# Patient Record
Sex: Male | Born: 1965 | Race: White | Hispanic: No | Marital: Married | Smoking: Never smoker
Health system: Southern US, Community
[De-identification: ages and names within clinical notes are randomized; demographics above are authoritative.]

---

## 2005-05-22 ENCOUNTER — Ambulatory Visit: Payer: Self-pay | Admitting: Oncology

## 2011-01-06 ENCOUNTER — Emergency Department: Payer: Self-pay | Admitting: Emergency Medicine

## 2011-10-02 ENCOUNTER — Ambulatory Visit: Payer: Self-pay | Admitting: Unknown Physician Specialty

## 2011-11-11 ENCOUNTER — Ambulatory Visit: Payer: Self-pay | Admitting: Unknown Physician Specialty

## 2011-11-11 LAB — URINALYSIS, COMPLETE
Blood: NEGATIVE
Glucose,UR: NEGATIVE mg/dL (ref 0–75)
Nitrite: NEGATIVE
Ph: 7 (ref 4.5–8.0)
RBC,UR: 1 /HPF (ref 0–5)
Specific Gravity: 1.018 (ref 1.003–1.030)
Squamous Epithelial: <1

## 2011-11-11 LAB — CBC
HCT: 44.3 % (ref 40.0–52.0)
HGB: 15.4 g/dL (ref 13.0–18.0)
MCH: 32.9 pg (ref 26.0–34.0)
MCHC: 34.7 g/dL (ref 32.0–36.0)
MCV: 95 fL (ref 80–100)
Platelet: 150 10*3/uL (ref 150–440)
RBC: 4.67 10*6/uL (ref 4.40–5.90)
WBC: 5.2 10*3/uL (ref 3.8–10.6)

## 2011-11-13 ENCOUNTER — Ambulatory Visit: Payer: Self-pay | Admitting: Unknown Physician Specialty

## 2014-09-23 ENCOUNTER — Ambulatory Visit: Payer: Self-pay | Admitting: Unknown Physician Specialty

## 2015-02-05 NOTE — Op Note (Signed)
PATIENT NAME:  Eric Joseph, [name] MR#:  [id number] DATE OF BIRTH:  Jun 16, 1966  DATE OF PROCEDURE:  11/13/2011  PREOPERATIVE DIAGNOSIS: Torn menisci, right knee, with possible chondral lesions.   POSTOPERATIVE DIAGNOSIS: Torn menisci, right knee, with possible chondral lesions.    OPERATION: Arthroscopic partial medial and lateral meniscectomies along with debridement and microfracture of multiple chondral lesions.   SURGEON: Alda BertholdHarold B. Jaidyn Kuhl, Jr., MD   ANESTHESIA: General.   HISTORY: The patient had had a remote ACL reconstruction of his right knee. Recently he was involved in an injury that had resulted in some discomfort referable to his right knee. MRI was ultimately obtained which was consistent with medial and lateral meniscal tears along with multiple chondral lesions. His ACL was felt to be intact. KT-1000 was performed which revealed mild laxity in his ACL reconstruction.   The patient was ultimately brought to surgery because of persistent discomfort despite conservative treatment.   PROCEDURE: The patient was taken to the operating room where satisfactory general anesthesia was achieved. A tourniquet and legholder were applied to the right thigh and the left lower extremity was supported with a well legholder. The right knee was prepped and draped in the usual fashion for an arthroscopic procedure. The patient was given 2 grams Kefzol IV prior to the start of the procedure. An inflow cannula was introduced superomedially. The joint was distended with lactated Ringer's. We used the Mitek fluid pump to facilitate joint distention.   The scope was introduced through an inferolateral puncture wound and a probe through an inferomedial puncture wound. 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 It measured about 8 or 9 mm in width and probably about 15 mm in depth. The patient had  had a previous resection of the posterior horn of his medial meniscus. He did have an anterior horn tear which I resected with a motorized resector and contoured with the thermal wand. I then debrided the medial femoral chondral lesion down to bone using a synovial resector and a curette. I then microfractured the lesion with a 45 degree awl. I turned my attention to the intercondylar notch.   There was considerable fraying of his ACL graft. I debrided the frayed tissue with a synovial resector and an ArthroCare 90 degree angled wand. I probed the ligament and the bulk of the reconstructed ACL ligament was intact. It was slightly loose so I tightened it up using the ArthroCare CapSure wand.   I then turned my attention to the lateral compartment. The patient had a tear of the middle and anterior thirds of his lateral meniscus.    I went ahead and resected the tear using a combination of a motorized resector and an ArthroCare thermal wand.   I did find a small connection between the midportion of the lateral meniscus tear and the previously noted parameniscal cyst. I debrided this connection with a small synovial resector and from outside in I introduced a spinal needle into the tunnel to try to promote some vascularity.   I contoured the resected area of the lateral meniscus with a 90 degree angled ArthroCare wand.   Inspection of the articular surface of the lateral compartment revealed the patient had grade II chondral changes in the weightbearing portion of his lateral femoral condyle.    Next, I inspected the trochlear groove.    The patient had a grade III chondral lesion in the  proximal portion of his trochlear groove. I debrided this area with a synovial resector and used a resector and a curette to debride down to bone. I then microfractured the lesion with a 45 degree awl.   I then inspected the retropatellar surface. There was some fibrillation of the retropatellar surface which I  debrided with a turbo whisker and Coblated with an ArthroCare Paragon wand.   The fluid in the joint was then decompressed through the cannulas. The cannulas were removed. The puncture wounds were closed with 3-0 nylon in vertical mattress fashion. Several mL of 0.5% Marcaine with epinephrine was injected about each puncture wound and then Betadine was applied to the wounds followed by a sterile dressing. Four TENS pads were placed about the left knee and then the patient was awakened and transferred to his stretcher bed. He was taken to the recovery room in satisfactory condition. Blood loss was negligible.   Incidentally, the tourniquet was not inflated during the course of the procedure.   ____________________________ Alda Berthold., MD hbk:drc D: 11/13/2011 11:11:00 ET T: 11/13/2011 11:21:02 ET JOB#: [id number]  cc: Alda Berthold., MD, <Dictator> Randon Goldsmith, Montez Hageman MD ELECTRONICALLY SIGNED 12/01/2011 14:45

## 2015-03-18 ENCOUNTER — Emergency Department
Admission: EM | Admit: 2015-03-18 | Discharge: 2015-03-18 | Disposition: A | Discharge status: Home / Self Care | Payer: 59 | Attending: Student | Admitting: Student

## 2015-03-18 ENCOUNTER — Other Ambulatory Visit: Payer: Self-pay

## 2015-03-18 ENCOUNTER — Emergency Department: Payer: 59

## 2015-03-18 ENCOUNTER — Encounter: Payer: Self-pay | Admitting: Radiology

## 2015-03-18 DIAGNOSIS — W11XXXA Fall on and from ladder, initial encounter: Secondary | ICD-10-CM | POA: Diagnosis not present

## 2015-03-18 DIAGNOSIS — S3992XA Unspecified injury of lower back, initial encounter: Secondary | ICD-10-CM | POA: Diagnosis not present

## 2015-03-18 DIAGNOSIS — Y9389 Activity, other specified: Secondary | ICD-10-CM | POA: Diagnosis not present

## 2015-03-18 DIAGNOSIS — Y9286 Slaughter house as the place of occurrence of the external cause: Secondary | ICD-10-CM | POA: Diagnosis not present

## 2015-03-18 DIAGNOSIS — Y998 Other external cause status: Secondary | ICD-10-CM | POA: Insufficient documentation

## 2015-03-18 DIAGNOSIS — M549 Dorsalgia, unspecified: Secondary | ICD-10-CM

## 2015-03-18 DIAGNOSIS — S3991XA Unspecified injury of abdomen, initial encounter: Secondary | ICD-10-CM | POA: Diagnosis not present

## 2015-03-18 DIAGNOSIS — S299XXA Unspecified injury of thorax, initial encounter: Secondary | ICD-10-CM | POA: Insufficient documentation

## 2015-03-18 DIAGNOSIS — M62838 Other muscle spasm: Secondary | ICD-10-CM

## 2015-03-18 DIAGNOSIS — R109 Unspecified abdominal pain: Secondary | ICD-10-CM

## 2015-03-18 DIAGNOSIS — S90511A Abrasion, right ankle, initial encounter: Secondary | ICD-10-CM | POA: Diagnosis not present

## 2015-03-18 DIAGNOSIS — R079 Chest pain, unspecified: Secondary | ICD-10-CM

## 2015-03-18 LAB — TROPONIN I: Troponin I: <0.03 ng/mL (ref <0.031)

## 2015-03-18 LAB — CBC WITH DIFFERENTIAL/PLATELET
Basophils Absolute: 0.1 10*3/uL (ref 0–0.1)
Basophils Relative: 1 %
EOS PCT: 1 %
Eosinophils Absolute: 0.1 10*3/uL (ref 0–0.7)
HCT: 50.1 % (ref 40.0–52.0)
Hemoglobin: 16.7 g/dL (ref 13.0–18.0)
Lymphocytes Relative: 17 %
Lymphs Abs: 1.9 10*3/uL (ref 1.0–3.6)
MCH: 32.2 pg (ref 26.0–34.0)
MCHC: 33.4 g/dL (ref 32.0–36.0)
MCV: 96.4 fL (ref 80.0–100.0)
MONOS PCT: 5 %
Monocytes Absolute: 0.6 10*3/uL (ref 0.2–1.0)
Neutro Abs: 8.4 10*3/uL — ABNORMAL HIGH (ref 1.4–6.5)
Neutrophils Relative %: 76 %
Platelets: 195 10*3/uL (ref 150–440)
RBC: 5.2 MIL/uL (ref 4.40–5.90)
RDW: 13.1 % (ref 11.5–14.5)
WBC: 11.1 10*3/uL — ABNORMAL HIGH (ref 3.8–10.6)

## 2015-03-18 LAB — COMPREHENSIVE METABOLIC PANEL
ALK PHOS: 71 U/L (ref 38–126)
ALT: 41 U/L (ref 17–63)
ANION GAP: 9 (ref 5–15)
AST: 34 U/L (ref 15–41)
Albumin: 4.6 g/dL (ref 3.5–5.0)
BUN: 19 mg/dL (ref 6–20)
CALCIUM: 9.6 mg/dL (ref 8.9–10.3)
CO2: 28 mmol/L (ref 22–32)
Chloride: 104 mmol/L (ref 101–111)
Creatinine, Ser: 0.86 mg/dL (ref 0.61–1.24)
GFR calc Af Amer: >60 mL/min (ref >60)
GFR calc non Af Amer: >60 mL/min (ref >60)
GLUCOSE: 102 mg/dL — AB (ref 65–99)
POTASSIUM: 4.2 mmol/L (ref 3.5–5.1)
Sodium: 141 mmol/L (ref 135–145)
Total Bilirubin: 0.7 mg/dL (ref 0.3–1.2)
Total Protein: 7.6 g/dL (ref 6.5–8.1)

## 2015-03-18 LAB — APTT: APTT: 25 s (ref 24–36)

## 2015-03-18 LAB — PROTIME-INR
INR: 0.91
PROTHROMBIN TIME: 12.5 s (ref 11.4–15.0)

## 2015-03-18 MED ORDER — CYCLOBENZAPRINE HCL 5 MG PO TABS: 5.0000 mg | ORAL_TABLET | Freq: Three times a day (TID) | ORAL | Status: DC | PRN

## 2015-03-18 MED ORDER — IBUPROFEN 600 MG PO TABS: 600.0000 mg | ORAL_TABLET | Freq: Four times a day (QID) | ORAL | Status: AC | PRN

## 2015-03-18 MED ORDER — ONDANSETRON HCL 4 MG/2ML IJ SOLN
INTRAMUSCULAR | Status: AC
  Administered 2015-03-18: 4 mg via INTRAVENOUS
  Filled 2015-03-18: qty 2

## 2015-03-18 MED ORDER — HYDROMORPHONE HCL 1 MG/ML IJ SOLN
INTRAMUSCULAR | Status: AC
  Administered 2015-03-18: 1 mg
  Filled 2015-03-18: qty 1

## 2015-03-18 MED ORDER — IOHEXOL 300 MG/ML  SOLN
100.0000 mL | Freq: Once | INTRAMUSCULAR | Status: AC | PRN
  Administered 2015-03-18: 100 mL via INTRAVENOUS

## 2015-03-18 MED ORDER — HYDROMORPHONE HCL 1 MG/ML IJ SOLN
INTRAMUSCULAR | Status: AC
  Administered 2015-03-18: 1 mg via INTRAVENOUS
  Filled 2015-03-18: qty 1

## 2015-03-18 MED ORDER — HYDROMORPHONE HCL 1 MG/ML IJ SOLN
1.0000 mg | Freq: Once | INTRAMUSCULAR | Status: AC
  Administered 2015-03-18: 1 mg via INTRAVENOUS

## 2015-03-18 MED ORDER — SODIUM CHLORIDE 0.9 % IV BOLUS (SEPSIS)
1000.0000 mL | Freq: Once | INTRAVENOUS | Status: AC
  Administered 2015-03-18: 1000 mL via INTRAVENOUS

## 2015-03-18 MED ORDER — BACITRACIN ZINC 500 UNIT/GM EX OINT
TOPICAL_OINTMENT | CUTANEOUS | Status: AC
  Filled 2015-03-18: qty 0.9

## 2015-03-18 MED ORDER — ONDANSETRON HCL 4 MG/2ML IJ SOLN
4.0000 mg | Freq: Once | INTRAMUSCULAR | Status: AC
  Administered 2015-03-18: 4 mg via INTRAVENOUS

## 2015-03-18 NOTE — ED Provider Notes (Signed)
[facility] Emergency Department Provider Note  ____________________________________________  Time seen: Approximately 4:09 PM  I have reviewed the triage vital signs and the nursing notes.   HISTORY  Chief Complaint Abdominal Injury    HPI Eric Joseph is a 49 y.o. male with no chronic medical problems presents for evaluation of chest, abdominal and back pain after fall from a ladder today. Patient was standing on a 6 foot tall ladder when he lost his footing and fell forward approximately 4-5 feet onto a retaining wall, landing on his abdomen. He did not hit his head and did not lose consciousness. He is not complaining of any head or neck pain. He reports that this happened at 1 PM. Prior to today he had been in his usual state of health. This happened suddenly, pain has been constant and worsening since onset. Currently his pain is severe and movement makes the pain worse. Prior to today he had been in his usual state of health without illness. Denies any alcohol or drug use.   History reviewed. No pertinent past medical history.  There are no active problems to display for this patient.   No past surgical history on file.  No current outpatient prescriptions on file.  Allergies Review of patient's allergies indicates no known allergies.  No family history on file.  Social History History  Substance Use Topics  . Smoking status: Not on file  . Smokeless tobacco: Not on file  . Alcohol Use: Not on file    Review of Systems Constitutional: No fever/chills Eyes: No visual changes. ENT: No sore throat. Cardiovascular: + chest pain. Respiratory: Denies shortness of breath. Gastrointestinal: + abdominal pain.  No nausea, no vomiting.  No diarrhea.  No constipation. Genitourinary: Negative for dysuria. Musculoskeletal: + for back pain. Skin: Negative for rash. Neurological: Negative for headaches, focal weakness or numbness.  10-point ROS  otherwise negative.  ____________________________________________   PHYSICAL EXAM:  VITAL SIGNS: ED Triage Vitals  Enc Vitals Group     BP 03/18/15 1551 143/97 mmHg     Pulse Rate 03/18/15 1551 78     Resp 03/18/15 1551 18     Temp 03/18/15 1551 97.8 F (36.6 C)     Temp Source 03/18/15 1551 Oral     SpO2 03/18/15 1551 98 %     Weight --      Height --      Head Cir --      Peak Flow --      Pain Score 03/18/15 1532 9     Pain Loc --      Pain Edu? --      Excl. in GC? --     Constitutional: Alert and oriented. Appears to be in severe distress secondary to pain. Eyes: Conjunctivae are normal. PERRL. EOMI. Head: Atraumatic. Nose: No congestion/rhinnorhea. Mouth/Throat: Mucous membranes are moist.  Oropharynx non-erythematous. Neck: No stridor. No cervical spine tenderness to palpation. Cardiovascular: Normal rate, regular rhythm. Grossly normal heart sounds.  Good peripheral circulation. Respiratory: Normal respiratory effort.  No retractions. Lungs CTAB. Gastrointestinal: Soft with moderate diffuse tenderness. No distention. No abdominal bruits. Large abrasions throughout the left abdomen. Genitourinary: deferred Musculoskeletal: Hemostatic Abrasion to the right anterior shin with some of the superficial skin listed. No bony tenderness, no step-off or abnormality throughout the right leg. Pulses stable to rock and compression. Moderate anterior chest wall tenderness, tender to palpation throughout the lower T/upper L-spine. Neurologic:  Normal speech and language. No gross  focal neurologic deficits are appreciated. Speech is normal. No gait instability. Skin:  Skin is warm, dry and intact. No rash noted. Psychiatric: Mood and affect are normal. Speech and behavior are normal.  ____________________________________________   LABS (all labs ordered are listed, but only abnormal results are displayed)  Labs Reviewed  CBC WITH DIFFERENTIAL/PLATELET - Abnormal; Notable for  the following:    WBC 11.1 (*)    Neutro Abs 8.4 (*)    All other components within normal limits  COMPREHENSIVE METABOLIC PANEL - Abnormal; Notable for the following:    Glucose, Bld 102 (*)    All other components within normal limits  TROPONIN I  APTT  PROTIME-INR   ____________________________________________  EKG  ED ECG REPORT I, Gayla DossGayle, Caron Tardif A, the attending physician, personally viewed and interpreted this ECG.   Date: 03/18/2015  EKG Time: 15:43  Rate: 78  Rhythm: normal sinus rhythm  Axis: Normal  Intervals:none, normal intervals  ST&T Change: No acute ST segment elevation  ____________________________________________  RADIOLOGY  CT chest, abdomen and pelvis IMPRESSION: Chest Impression:  No evidence of thoracic trauma  Abdomen / Pelvis Impression:  1. No evidence of solid organ injury in the abdomen or pelvis. 2. No evidence of fracture. ____________________________________________   PROCEDURES  Procedure(s) performed: None  Critical Care performed: Yes, see critical care note(s)  ____________________________________________   INITIAL IMPRESSION / ASSESSMENT AND PLAN / ED COURSE  Pertinent labs & imaging results that were available during my care of the patient were reviewed by me and considered in my medical decision making (see chart for details).  Eric Joseph is a 49 y.o. male with no chronic medical problems presents for evaluation of chest, abdominal and back pain after fall from a ladder today. On arrival, vital signs stable, afebrile. He is in moderate distress secondary to pain. He has tenderness throughout the chest wall, back, abdomen. Given his pain complaints and the nature of traumatic injury, we'll obtain labs, EKG, trauma scan CT of the chest, abdomen and pelvis. No head injury or loss of consciousness, no neck pain. No indication for imaging of the head or C-spine. Tetanus up to  date.  ----------------------------------------- 6:18 PM on 03/18/2015 -----------------------------------------  Labs and imaging unremarkable. Patient reports he feels much better. Suspect musculoskeletal pain with spasms as the primary cause of his symptoms in the setting of fall. Vital signs stable. Return precautions discussed. DC was symptomatically support ECP follow-up. Patient is, trouble with the discharge plan. ____________________________________________   FINAL CLINICAL IMPRESSION(S) / ED DIAGNOSES  Final diagnoses:  Fall from ladder, initial encounter  Chest pain, unspecified chest pain type  Abdominal pain, unspecified abdominal location  Bilateral back pain, unspecified location  Muscle spasm      Gayla DossEryka A Dody Smartt, MD 03/18/15 1819

## 2015-03-18 NOTE — ED Notes (Addendum)
Patient fell 4 feet off a ladder. Landed on a retaining wall with his abdomen. C/o pain to his abdomen and back. Abrasions but no bruising to abdomen noted. Patient states that the event happened at 13:00 and his pain has worsened with time.

## 2018-08-04 ENCOUNTER — Emergency Department
Admission: EM | Admit: 2018-08-04 | Discharge: 2018-08-04 | Disposition: A | Discharge status: Home / Self Care | Payer: BLUE CROSS/BLUE SHIELD | Attending: Emergency Medicine | Admitting: Emergency Medicine

## 2018-08-04 ENCOUNTER — Encounter: Payer: Self-pay | Admitting: Emergency Medicine

## 2018-08-04 ENCOUNTER — Emergency Department: Payer: BLUE CROSS/BLUE SHIELD

## 2018-08-04 ENCOUNTER — Other Ambulatory Visit: Payer: Self-pay

## 2018-08-04 DIAGNOSIS — Y92009 Unspecified place in unspecified non-institutional (private) residence as the place of occurrence of the external cause: Secondary | ICD-10-CM | POA: Insufficient documentation

## 2018-08-04 DIAGNOSIS — Z23 Encounter for immunization: Secondary | ICD-10-CM | POA: Insufficient documentation

## 2018-08-04 DIAGNOSIS — Z79899 Other long term (current) drug therapy: Secondary | ICD-10-CM | POA: Insufficient documentation

## 2018-08-04 DIAGNOSIS — S0083XA Contusion of other part of head, initial encounter: Secondary | ICD-10-CM | POA: Diagnosis not present

## 2018-08-04 DIAGNOSIS — W01198A Fall on same level from slipping, tripping and stumbling with subsequent striking against other object, initial encounter: Secondary | ICD-10-CM | POA: Diagnosis not present

## 2018-08-04 DIAGNOSIS — S0081XA Abrasion of other part of head, initial encounter: Secondary | ICD-10-CM | POA: Insufficient documentation

## 2018-08-04 DIAGNOSIS — Y999 Unspecified external cause status: Secondary | ICD-10-CM | POA: Diagnosis not present

## 2018-08-04 DIAGNOSIS — Y9301 Activity, walking, marching and hiking: Secondary | ICD-10-CM | POA: Diagnosis not present

## 2018-08-04 DIAGNOSIS — S0990XA Unspecified injury of head, initial encounter: Secondary | ICD-10-CM | POA: Diagnosis present

## 2018-08-04 MED ORDER — MELOXICAM 15 MG PO TABS
15.0000 mg | ORAL_TABLET | Freq: Every day | ORAL | 0 refills | Status: AC
Start: 2018-08-04 — End: ?

## 2018-08-04 MED ORDER — CEPHALEXIN 500 MG PO CAPS: 1000.0000 mg | ORAL_CAPSULE | Freq: Two times a day (BID) | ORAL | 0 refills | Status: DC

## 2018-08-04 MED ORDER — TETANUS-DIPHTH-ACELL PERTUSSIS 5-2.5-18.5 LF-MCG/0.5 IM SUSP
0.5000 mL | Freq: Once | INTRAMUSCULAR | Status: AC
  Administered 2018-08-04: 0.5 mL via INTRAMUSCULAR
  Filled 2018-08-04: qty 0.5

## 2018-08-04 NOTE — ED Notes (Signed)
See triage note  States he tripped and slid face first on gravel  Abrasion noted to forehead  Swelling and bruising noted under eyes

## 2018-08-04 NOTE — ED Triage Notes (Signed)
Pt in via POV, reports mechanical fall outside Sunday night, tripping over a bin, landing on trash bag w/ face hitting gravel.  Pt denies LOC, denies being on blood thinners.  Pt reports swelling and tenderness has become worse over the last day.  Pt with excoriation to forehead, swelling noted from base of forehead, left eye and into bridge of nose.  Pt ambulatory to triage, denies any neck pain.  NAD noted at this time.

## 2018-08-04 NOTE — ED Provider Notes (Signed)
[facility] Emergency Department Provider Note  ____________________________________________  Time seen: Approximately 5:42 PM  I have reviewed the triage vital signs and the nursing notes.   HISTORY  Chief Complaint Facial Injury    HPI Eric Joseph is a 52 y.o. male who presents the emergency department for increasing pain, swelling, ecchymosis to the skull and face after head injury.  Patient reports that he was attempting to take some trash out 2 days prior, tripped and fell landing striking the frontal skull gravel.  Patient reports no loss of consciousness initially or subsequently to his head trauma.  Patient reports that he had some mild pain in the region but associated with a mild contusion.  Patient reports that since then he has had increasing pressure in the nasal ridge region, increasing swelling, increasing ecchymosis.  Patient initially did not have any ecchymosis or edema.  He did sustain abrasions to the forehead.  Patient declines these.  He is now experiencing increasing edema as well as bilateral orbital ecchymosis.  Patient denies any serosanguineous fluid drainage from the ears or nares.  Patient reports majority of pain is in the face.  He denies any significant headache.  Patient denies any visual changes, radicular symptoms in the upper or lower extremities, chest pain, shortness of breath, nausea, emesis.  Over-the-counter medications have not improved symptoms.  Patient does have a previous history of head injuries with multiple concussions.    History reviewed. No pertinent past medical history.  There are no active problems to display for this patient.   History reviewed. No pertinent surgical history.  Prior to Admission medications   Medication Sig Start Date End Date Taking? Authorizing Provider  cephALEXin (KEFLEX) 500 MG capsule Take 2 capsules (1,000 mg total) by mouth 2 (two) times daily. 08/04/18   Aliou Mealey, Delorise Royals,  PA-C  cyclobenzaprine (FLEXERIL) 5 MG tablet Take 1 tablet (5 mg total) by mouth every 8 (eight) hours as needed for muscle spasms (Do Not drive while taking this medication.). 03/18/15   Gayla Doss, MD  DULoxetine (CYMBALTA) 30 MG capsule Take 30 mg by mouth at bedtime. 02/02/15   [provider]  ibuprofen (ADVIL,MOTRIN) 600 MG tablet Take 1 tablet (600 mg total) by mouth every 6 (six) hours as needed for moderate pain. 03/18/15   Gayla Doss, MD  meloxicam (MOBIC) 15 MG tablet Take 1 tablet (15 mg total) by mouth daily. 08/04/18   Ernesto Zukowski, Delorise Royals, PA-C  methylphenidate 10 MG ER tablet Take 10 mg by mouth daily.    [provider]    Allergies Patient has no known allergies.  No family history on file.  Social History Social History   Tobacco Use  . Smoking status: Never Smoker  . Smokeless tobacco: Never Used  Substance Use Topics  . Alcohol use: Not Currently  . Drug use: Never     Review of Systems  Constitutional: No fever/chills Eyes: No visual changes.  ENT: No upper respiratory complaints. Cardiovascular: no chest pain. Respiratory: no cough. No SOB. Gastrointestinal: No abdominal pain.  No nausea, no vomiting.  Musculoskeletal: Positive for pain to the medial face with edema over the nasal bridge and bilateral orbital ecchymosis. Skin: Deep abrasions to the forehead. Neurological: Negative for headaches, focal weakness or numbness. 10-point ROS otherwise negative.  ____________________________________________   PHYSICAL EXAM:  VITAL SIGNS: ED Triage Vitals  Enc Vitals Group     BP 08/04/18 1646 127/82     Pulse Rate  08/04/18 1646 71     Resp --      Temp 08/04/18 1646 98.1 F (36.7 C)     Temp Source 08/04/18 1646 Oral     SpO2 08/04/18 1646 97 %     Weight 08/04/18 1646 210 lb (95.3 kg)     Height 08/04/18 1646 6\' 2"  (1.88 m)     Head Circumference --      Peak Flow --      Pain Score 08/04/18 1654 2     Pain Loc --       Pain Edu? --      Excl. in GC? --      Constitutional: Alert and oriented. Well appearing and in no acute distress. Eyes: Conjunctivae are normal. PERRL. EOMI. Head: Visualization of the patient's face reveals deep abrasions to the forehead.  These were both scabbed over with no bleeding.  No surrounding edema or ecchymosis.  Visualization of the nasal bridge reveals edema but no laceration, abrasions or significant ecchymosis.  Visualization of the orbits reveals bilateral ecchymosis in the inferior orbits.  Mild edema in this region as well.  Patient is very tender to palpation along the nasal bridge, inferior orbits.  No palpable abnormality or subcutaneous emphysema.  No battle signs.  No serosanguineous fluid drainage from the ears or nares.  Patient is nontender to palpation over the remaining osseous structures of the skull and face. ENT:      Ears:       Nose: No congestion/rhinnorhea.      Mouth/Throat: Mucous membranes are moist.  Neck: No stridor.  No cervical spine tenderness to palpation.  Full range of motion to the cervical spine.  Radial pulse intact bilateral upper extremities.  Sensation intact and equal bilateral upper extremities.  Cardiovascular: Normal rate, regular rhythm. Normal S1 and S2.  Good peripheral circulation. Respiratory: Normal respiratory effort without tachypnea or retractions. Lungs CTAB. Good air entry to the bases with no decreased or absent breath sounds. Musculoskeletal: Full range of motion to all extremities. No gross deformities appreciated. Neurologic:  Normal speech and language. No gross focal neurologic deficits are appreciated.  Cranial nerves II through XII grossly intact.  Negative Romberg's and pronator drift. Skin:  Skin is warm, dry and intact. No rash noted. Psychiatric: Mood and affect are normal. Speech and behavior are normal. Patient exhibits appropriate insight and judgement.   ____________________________________________    LABS (all labs ordered are listed, but only abnormal results are displayed)  Labs Reviewed - No data to display ____________________________________________  EKG   ____________________________________________  RADIOLOGY I personally viewed and evaluated these images as part of my medical decision making, as well as reviewing the written report by the radiologist.  Ct Head Wo Contrast  Result Date: 08/04/2018 CLINICAL DATA:  52 y/o M; fall 2 days prior with head injury. Increasing pain to the frontal skull. Raccoon eyes. Headache. EXAM: CT HEAD WITHOUT CONTRAST CT MAXILLOFACIAL WITHOUT CONTRAST CT CERVICAL SPINE WITHOUT CONTRAST TECHNIQUE: Multidetector CT imaging of the head, cervical spine, and maxillofacial structures were performed using the standard protocol without intravenous contrast. Multiplanar CT image reconstructions of the cervical spine and maxillofacial structures were also generated. COMPARISON:  None. FINDINGS: CT HEAD FINDINGS Brain: No evidence of acute infarction, hemorrhage, hydrocephalus, extra-axial collection or mass lesion/mass effect. Vascular: No hyperdense vessel or unexpected calcification. Skull: Mild frontal scalp contusion. No calvarial fracture. Other: None. CT MAXILLOFACIAL FINDINGS Osseous: Mildly displaced fracture of right nasal bones, age indeterminate. Chronic  appearing fracture of the anterior maxillary spine. Orbits: Negative. No traumatic or inflammatory finding. Sinuses: Clear. Soft tissues: Mild swelling of frontal scalp, periorbital soft tissues, and nasal bridge. CT CERVICAL SPINE FINDINGS Alignment: Normal. Skull base and vertebrae: No acute fracture. No primary bone lesion or focal pathologic process. Partial C5-6 interbody and facet fusion on the left. 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 Soft tissues and spinal canal: No prevertebral fluid or swelling. No visible canal  hematoma. Disc levels: Discogenic degenerative changes with moderate loss of disc space height at the C3-4 level and mild loss of intervertebral disc space height at the C4-5 level. Advanced right upper cervical and left C4-5 facet hypertrophy. Uncovertebral and facet hypertrophy results in bony foraminal stenosis at the right C2-3 level, right C3-4 level, bilateral C4-5 level. Additionally, there is mild-to-moderate multifactorial canal stenosis greatest at C3-4. Upper chest: Negative. Other: Negative. IMPRESSION: CT head: 1. Mild frontal scalp contusion. No calvarial fracture. 2. No acute intracranial abnormality identified. CT maxillofacial: 1. Mildly displaced fracture of right nasal bones, age indeterminate. 2. Chronic appearing fracture of anterior maxillary spine. 3. Mild swelling of frontal scalp, periorbital soft tissues, and nasal bridge. CT cervical spine: 1. No acute fracture or dislocation identified. 2. Moderate cervical spondylosis greatest at C3-4. Electronically Signed   By: Mitzi Hansen M.D.   On: 08/04/2018 19:02   Ct Cervical Spine Wo Contrast  Result Date: 08/04/2018 CLINICAL DATA:  52 y/o M; fall 2 days prior with head injury. Increasing pain to the frontal skull. Raccoon eyes. Headache. EXAM: CT HEAD WITHOUT CONTRAST CT MAXILLOFACIAL WITHOUT CONTRAST CT CERVICAL SPINE WITHOUT CONTRAST TECHNIQUE: Multidetector CT imaging of the head, cervical spine, and maxillofacial structures were performed using the standard protocol without intravenous contrast. Multiplanar CT image reconstructions of the cervical spine and maxillofacial structures were also generated. COMPARISON:  None. FINDINGS: CT HEAD FINDINGS Brain: No evidence of acute infarction, hemorrhage, hydrocephalus, extra-axial collection or mass lesion/mass effect. Vascular: No hyperdense vessel or unexpected calcification. Skull: Mild frontal scalp contusion. No calvarial fracture. Other: None. CT MAXILLOFACIAL FINDINGS  Osseous: Mildly displaced fracture of right nasal bones, age indeterminate. Chronic appearing fracture of the anterior maxillary spine. Orbits: Negative. No traumatic or inflammatory finding. Sinuses: Clear. Soft tissues: Mild swelling of frontal scalp, periorbital soft tissues, and nasal bridge. CT CERVICAL SPINE FINDINGS Alignment: Normal. Skull base and vertebrae: No acute fracture. No primary bone lesion or focal pathologic process. Partial C5-6 interbody and facet fusion on the left. 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 Soft tissues and spinal canal: No prevertebral fluid or swelling. No visible canal hematoma. Disc levels: Discogenic degenerative changes with moderate loss of disc space height at the C3-4 level and mild loss of intervertebral disc space height at the C4-5 level. Advanced right upper cervical and left C4-5 facet hypertrophy. Uncovertebral and facet hypertrophy results in bony foraminal stenosis at the right C2-3 level, right C3-4 level, bilateral C4-5 level. Additionally, there is mild-to-moderate multifactorial canal stenosis greatest at C3-4. Upper chest: Negative. Other: Negative. IMPRESSION: CT head: 1. Mild frontal scalp contusion. No calvarial fracture. 2. No acute intracranial abnormality identified. CT maxillofacial: 1. Mildly displaced fracture of right nasal bones, age indeterminate. 2. Chronic appearing fracture of anterior maxillary spine. 3. Mild swelling of frontal scalp, periorbital soft tissues, and nasal bridge. CT cervical spine: 1. No acute fracture or dislocation identified. 2.  Moderate cervical spondylosis greatest at C3-4. Electronically Signed   By: Mitzi Hansen M.D.   On: 08/04/2018 19:02   Ct Maxillofacial Wo Contrast  Result Date: 08/04/2018 CLINICAL DATA:  52 y/o M; fall 2 days prior with head injury. Increasing pain to the frontal skull. Raccoon eyes. Headache. EXAM: CT HEAD  WITHOUT CONTRAST CT MAXILLOFACIAL WITHOUT CONTRAST CT CERVICAL SPINE WITHOUT CONTRAST TECHNIQUE: Multidetector CT imaging of the head, cervical spine, and maxillofacial structures were performed using the standard protocol without intravenous contrast. Multiplanar CT image reconstructions of the cervical spine and maxillofacial structures were also generated. COMPARISON:  None. FINDINGS: CT HEAD FINDINGS Brain: No evidence of acute infarction, hemorrhage, hydrocephalus, extra-axial collection or mass lesion/mass effect. Vascular: No hyperdense vessel or unexpected calcification. Skull: Mild frontal scalp contusion. No calvarial fracture. Other: None. CT MAXILLOFACIAL FINDINGS Osseous: Mildly displaced fracture of right nasal bones, age indeterminate. Chronic appearing fracture of the anterior maxillary spine. Orbits: Negative. No traumatic or inflammatory finding. Sinuses: Clear. Soft tissues: Mild swelling of frontal scalp, periorbital soft tissues, and nasal bridge. CT CERVICAL SPINE FINDINGS Alignment: Normal. Skull base and vertebrae: No acute fracture. No primary bone lesion or focal pathologic process. Partial C5-6 interbody and facet fusion on the left. 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 Soft tissues and spinal canal: No prevertebral fluid or swelling. No visible canal hematoma. Disc levels: Discogenic degenerative changes with moderate loss of disc space height at the C3-4 level and mild loss of intervertebral disc space height at the C4-5 level. Advanced right upper cervical and left C4-5 facet hypertrophy. Uncovertebral and facet hypertrophy results in bony foraminal stenosis at the right C2-3 level, right C3-4 level, bilateral C4-5 level. Additionally, there is mild-to-moderate multifactorial canal stenosis greatest at C3-4. Upper chest: Negative. Other: Negative. IMPRESSION: CT head: 1. Mild frontal scalp contusion. No  calvarial fracture. 2. No acute intracranial abnormality identified. CT maxillofacial: 1. Mildly displaced fracture of right nasal bones, age indeterminate. 2. Chronic appearing fracture of anterior maxillary spine. 3. Mild swelling of frontal scalp, periorbital soft tissues, and nasal bridge. CT cervical spine: 1. No acute fracture or dislocation identified. 2. Moderate cervical spondylosis greatest at C3-4. Electronically Signed   By: Mitzi Hansen M.D.   On: 08/04/2018 19:02    ____________________________________________    PROCEDURES  Procedure(s) performed:    Procedures    Medications  Tdap (BOOSTRIX) injection 0.5 mL (has no administration in time range)     ____________________________________________   INITIAL IMPRESSION / ASSESSMENT AND PLAN / ED COURSE  Pertinent labs & imaging results that were available during my care of the patient were reviewed by me and considered in my medical decision making (see chart for details).  Review of the [place] CSRS was performed in accordance of the [facility] prior to dispensing any controlled drugs.      Patient's diagnosis is consistent with abrasion of the face and contusion of the face.  Patient presents emergency department after a fall striking his face on the ground.  Patient had noticed increased edema, ecchymosis around the nasal bridge and periorbital region.  Patient did have positive raccoon eyes on exam.  Given findings, CT scan of the face, head, cervical spine was performed.  Patient has had a previous history of facial fractures, surgery.  There was an age indeterminant fracture of the nasal bridge.  I discussed the findings with patient, but without previous imaging studies on record, unable to  determine whether this is acute or from previous facial injuries.  At this time, no urgent intervention even should this be a nasal bridge fracture is warranted.  Patient will be placed on meloxicam for edema and pain relief.   Patient will be placed on antibiotics prophylactically given nature of multiple abrasions to the face.  Patient's tetanus shot was up-to-date and updated at this time.  Follow-up with primary care or ENT if necessary. Patient is given ED precautions to return to the ED for any worsening or new symptoms.     ____________________________________________  FINAL CLINICAL IMPRESSION(S) / ED DIAGNOSES  Final diagnoses:  Abrasion of face, initial encounter  Contusion of face, initial encounter      NEW MEDICATIONS STARTED DURING THIS VISIT:  ED Discharge Orders         Ordered    cephALEXin (KEFLEX) 500 MG capsule  2 times daily     08/04/18 2017    meloxicam (MOBIC) 15 MG tablet  Daily     08/04/18 2017              This chart was dictated using voice recognition software/Dragon. Despite best efforts to proofread, errors can occur which can change the meaning. Any change was purely unintentional.    Racheal Patches, PA-C 08/04/18 2020    Sharyn Creamer, MD 08/05/18 0010

## 2020-07-13 ENCOUNTER — Other Ambulatory Visit: Payer: Self-pay

## 2020-07-13 ENCOUNTER — Encounter: Payer: Self-pay | Admitting: Radiology

## 2020-07-13 ENCOUNTER — Emergency Department: Payer: 59

## 2020-07-13 ENCOUNTER — Emergency Department
Admission: EM | Admit: 2020-07-13 | Discharge: 2020-07-13 | Disposition: A | Discharge status: Home / Self Care | Payer: 59 | Attending: Emergency Medicine | Admitting: Emergency Medicine

## 2020-07-13 DIAGNOSIS — R519 Headache, unspecified: Secondary | ICD-10-CM | POA: Insufficient documentation

## 2020-07-13 DIAGNOSIS — Z20822 Contact with and (suspected) exposure to covid-19: Secondary | ICD-10-CM | POA: Diagnosis not present

## 2020-07-13 LAB — CBC WITH DIFFERENTIAL/PLATELET
Abs Immature Granulocytes: 0.02 10*3/uL (ref 0.00–0.07)
Basophils Absolute: 0.1 10*3/uL (ref 0.0–0.1)
Basophils Relative: 1 %
Eosinophils Absolute: 0.1 10*3/uL (ref 0.0–0.5)
Eosinophils Relative: 2 %
HCT: 44.3 % (ref 39.0–52.0)
Hemoglobin: 15.7 g/dL (ref 13.0–17.0)
Immature Granulocytes: 0 %
Lymphocytes Relative: 30 %
Lymphs Abs: 1.9 10*3/uL (ref 0.7–4.0)
MCH: 33.8 pg (ref 26.0–34.0)
MCHC: 35.4 g/dL (ref 30.0–36.0)
MCV: 95.3 fL (ref 80.0–100.0)
Monocytes Absolute: 0.5 10*3/uL (ref 0.1–1.0)
Monocytes Relative: 8 %
Neutro Abs: 3.8 10*3/uL (ref 1.7–7.7)
Neutrophils Relative %: 59 %
Platelets: 215 10*3/uL (ref 150–400)
RBC: 4.65 MIL/uL (ref 4.22–5.81)
RDW: 12.2 % (ref 11.5–15.5)
WBC: 6.3 10*3/uL (ref 4.0–10.5)
nRBC: 0 % (ref 0.0–0.2)

## 2020-07-13 LAB — COMPREHENSIVE METABOLIC PANEL
ALT: 35 U/L (ref 0–44)
AST: 23 U/L (ref 15–41)
Albumin: 4.1 g/dL (ref 3.5–5.0)
Alkaline Phosphatase: 66 U/L (ref 38–126)
Anion gap: 10 (ref 5–15)
BUN: 17 mg/dL (ref 6–20)
CO2: 27 mmol/L (ref 22–32)
Calcium: 8.8 mg/dL — ABNORMAL LOW (ref 8.9–10.3)
Chloride: 104 mmol/L (ref 98–111)
Creatinine, Ser: 0.75 mg/dL (ref 0.61–1.24)
GFR calc Af Amer: >60 mL/min (ref >60)
GFR calc non Af Amer: >60 mL/min (ref >60)
Glucose, Bld: 100 mg/dL — ABNORMAL HIGH (ref 70–99)
Potassium: 3.8 mmol/L (ref 3.5–5.1)
Sodium: 141 mmol/L (ref 135–145)
Total Bilirubin: 0.7 mg/dL (ref 0.3–1.2)
Total Protein: 6.8 g/dL (ref 6.5–8.1)

## 2020-07-13 LAB — RESPIRATORY PANEL BY RT PCR (FLU A&B, COVID)
Influenza A by PCR: NEGATIVE
Influenza B by PCR: NEGATIVE
SARS Coronavirus 2 by RT PCR: NEGATIVE

## 2020-07-13 LAB — SEDIMENTATION RATE: Sed Rate: 5 mm/hr (ref 0–20)

## 2020-07-13 MED ORDER — SODIUM CHLORIDE 0.9 % IV BOLUS
1000.0000 mL | Freq: Once | INTRAVENOUS | Status: AC
  Administered 2020-07-13: 1000 mL via INTRAVENOUS

## 2020-07-13 MED ORDER — IOHEXOL 350 MG/ML SOLN
75.0000 mL | Freq: Once | INTRAVENOUS | Status: AC | PRN
  Administered 2020-07-13: 75 mL via INTRAVENOUS
  Filled 2020-07-13: qty 75

## 2020-07-13 MED ORDER — DEXAMETHASONE SODIUM PHOSPHATE 10 MG/ML IJ SOLN
10.0000 mg | Freq: Once | INTRAMUSCULAR | Status: AC
  Administered 2020-07-13: 10 mg via INTRAVENOUS
  Filled 2020-07-13: qty 1

## 2020-07-13 MED ORDER — METOCLOPRAMIDE HCL 5 MG/ML IJ SOLN
10.0000 mg | Freq: Once | INTRAMUSCULAR | Status: AC
  Administered 2020-07-13: 10 mg via INTRAVENOUS
  Filled 2020-07-13: qty 2

## 2020-07-13 MED ORDER — KETOROLAC TROMETHAMINE 30 MG/ML IJ SOLN
30.0000 mg | Freq: Once | INTRAMUSCULAR | Status: AC
  Administered 2020-07-13: 30 mg via INTRAVENOUS
  Filled 2020-07-13: qty 1

## 2020-07-13 MED ORDER — AMOXICILLIN 500 MG PO CAPS
500.0000 mg | ORAL_CAPSULE | Freq: Once | ORAL | Status: AC
  Administered 2020-07-13: 500 mg via ORAL
  Filled 2020-07-13: qty 1

## 2020-07-13 MED ORDER — DIPHENHYDRAMINE HCL 50 MG/ML IJ SOLN
50.0000 mg | Freq: Once | INTRAMUSCULAR | Status: AC
  Administered 2020-07-13: 50 mg via INTRAVENOUS
  Filled 2020-07-13: qty 1

## 2020-07-13 MED ORDER — AMOXICILLIN 875 MG PO TABS: 875.0000 mg | ORAL_TABLET | Freq: Two times a day (BID) | ORAL | 0 refills | Status: AC

## 2020-07-13 NOTE — ED Provider Notes (Signed)
Emergency Department Provider Note  ____________________________________________  Time seen: Approximately 6:15 PM  I have reviewed the triage vital signs and the nursing notes.   HISTORY  Chief Complaint Migraine   Historian Patient     HPI Eric Joseph is a 54 y.o. male presents to the emergency department with an acute, 10 out of 10 left-sided temporal headache that started acutely today.  Patient states that last night he felt of having sensation along his left jaw and has had some mild discomfort chewing on the left.  He denies other falls or mechanisms of trauma.  He denies changes in vision, photophobia or floaters.  He denies a history of headaches and states he has never felt discomfort like this in the past.  He states that the pain has progressed slowly in intensity.  He denies fever and chills.  He saw his primary care provider earlier in the day and was referred to the emergency department.  He denies chest pain, chest tightness or abdominal pain.   History reviewed. No pertinent past medical history.   Immunizations up to date:  Yes.     History reviewed. No pertinent past medical history.  There are no problems to display for this patient.   No past surgical history on file.  Prior to Admission medications   Medication Sig Start Date End Date Taking? Authorizing Provider  amoxicillin (AMOXIL) 875 MG tablet Take 1 tablet (875 mg total) by mouth 2 (two) times daily for 10 days. 07/13/20 07/23/20  Lannie Fields, PA-C  cephALEXin (KEFLEX) 500 MG capsule Take 2 capsules (1,000 mg total) by mouth 2 (two) times daily. 08/04/18   Cuthriell, Charline Bills, PA-C  DULoxetine (CYMBALTA) 30 MG capsule Take 30 mg by mouth at bedtime. 02/02/15   [provider]  ibuprofen (ADVIL,MOTRIN) 600 MG tablet Take 1 tablet (600 mg total) by mouth every 6 (six) hours as needed for moderate pain. 03/18/15   Joanne Gavel, MD  meloxicam (MOBIC) 15 MG tablet Take 1 tablet  (15 mg total) by mouth daily. 08/04/18   Cuthriell, Charline Bills, PA-C  methylphenidate 10 MG ER tablet Take 10 mg by mouth daily.    [provider]    Allergies Patient has no known allergies.  No family history on file.  Social History Social History   Tobacco Use  . Smoking status: Never Smoker  . Smokeless tobacco: Never Used  Vaping Use  . Vaping Use: Never used  Substance Use Topics  . Alcohol use: Not Currently  . Drug use: Never     Review of Systems  Constitutional: No fever/chills Eyes:  No discharge ENT: No upper respiratory complaints. Respiratory: no cough. No SOB/ use of accessory muscles to breath Gastrointestinal:   No nausea, no vomiting.  No diarrhea.  No constipation. Musculoskeletal: Negative for musculoskeletal pain. Neuro: Patient has headache.  Skin: Negative for rash, abrasions, lacerations, ecchymosis.    ____________________________________________   PHYSICAL EXAM:  VITAL SIGNS: ED Triage Vitals [07/13/20 1651]  Enc Vitals Group     BP 127/82     Pulse Rate 64     Resp 16     Temp 98.1 F (36.7 C)     Temp Source Oral     SpO2 99 %     Weight 215 lb (97.5 kg)     Height 6' 2" (1.88 m)     Head Circumference      Peak Flow      Pain Score  10     Pain Loc      Pain Edu?      Excl. in [place]?      Constitutional: Alert and oriented. Well appearing and in no acute distress. Eyes: Conjunctivae are normal. PERRL. EOMI. Head: Atraumatic.  No palpable cord. ENT:      Ears: TM on the left is bulging and erythematous.      Nose: No congestion/rhinnorhea.      Mouth/Throat: Mucous membranes are moist.  Neck: No stridor.  No cervical spine tenderness to palpation. Cardiovascular: Normal rate, regular rhythm. Normal S1 and S2.  Good peripheral circulation. Respiratory: Normal respiratory effort without tachypnea or retractions. Lungs CTAB. Good air entry to the bases with no decreased or absent breath sounds Musculoskeletal: Full  range of motion to all extremities. No obvious deformities noted Neurologic:  Normal for age. No gross focal neurologic deficits are appreciated.  Skin:  Skin is warm, dry and intact. No rash noted. Psychiatric: Mood and affect are normal for age. Speech and behavior are normal.   ____________________________________________   LABS (all labs ordered are listed, but only abnormal results are displayed)  Labs Reviewed  COMPREHENSIVE METABOLIC PANEL - Abnormal; Notable for the following components:      Result Value   Glucose, Bld 100 (*)    Calcium 8.8 (*)    All other components within normal limits  RESPIRATORY PANEL BY RT PCR (FLU A&B, COVID)  CBC WITH DIFFERENTIAL/PLATELET  SEDIMENTATION RATE  C-REACTIVE PROTEIN   ____________________________________________  EKG   ____________________________________________  RADIOLOGY Unk Pinto, personally viewed and evaluated these images (plain radiographs) as part of my medical decision making, as well as reviewing the written report by the radiologist.  CT Angio Head W or Wo Contrast  Result Date: 07/13/2020 CLINICAL DATA:  Dizziness.  Migraine. EXAM: CT ANGIOGRAPHY HEAD TECHNIQUE: Multidetector CT imaging of the head was performed using the standard protocol during bolus administration of intravenous contrast. Multiplanar CT image reconstructions and MIPs were obtained to evaluate the vascular anatomy. CONTRAST:  56m OMNIPAQUE IOHEXOL 350 MG/ML SOLN COMPARISON:  None. FINDINGS: Anterior circulation: The internal carotid arteries are widely patent from skull base to carotid termini. ACAs and MCAs are patent without evidence of a proximal branch occlusion or significant proximal stenosis. Mild diffuse irregular appearance of the predominantly distal branch vessels is considered artifactual. No aneurysm is identified. Posterior circulation: The visualized distal vertebral arteries are patent to the basilar with the left being strongly  dominant. Patent right PICA, left AICA, and bilateral SCA origins are identified. The basilar artery is widely patent. Posterior communicating arteries are not identified and may be diminutive or absent. PCAs are patent without evidence of a significant proximal stenosis. No aneurysm is identified. Venous sinuses: Patent. Anatomic variants: None. IMPRESSION: Negative head CTA. Electronically Signed   By: ALogan BoresM.D.   On: 07/13/2020 19:47   CT Head Wo Contrast  Result Date: 07/13/2020 CLINICAL DATA:  Headache, classic migraine. EXAM: CT HEAD WITHOUT CONTRAST TECHNIQUE: Contiguous axial images were obtained from the base of the skull through the vertex without intravenous contrast. COMPARISON:  Head CT 08/04/2018. FINDINGS: Brain: Cerebral volume is normal. There is no acute intracranial hemorrhage. No demarcated cortical infarct. No extra-axial fluid collection. No evidence of intracranial mass. No midline shift. Partially empty sella turcica. Vascular: No hyperdense vessel. Skull: Normal. Negative for fracture or focal lesion. Sinuses/Orbits: Visualized orbits show no acute finding. Mild ethmoid sinus mucosal thickening. No significant mastoid  effusion. Mucosal thickening is also noted within the bilateral nasal passages. IMPRESSION: No evidence of acute intracranial abnormality. Partially empty sella turcica. This finding is very commonly incidental, but can be associated with idiopathic intracranial hypertension. Mild ethmoid sinus mucosal thickening. Mucosal thickening is also noted within the bilateral nasal passages. Electronically Signed   By: Kellie Simmering DO   On: 07/13/2020 17:20    ____________________________________________    PROCEDURES  Procedure(s) performed:     Procedures     Medications  metoCLOPramide (REGLAN) injection 10 mg (10 mg Intravenous Given 07/13/20 1826)  diphenhydrAMINE (BENADRYL) injection 50 mg (50 mg Intravenous Given 07/13/20 1826)  dexamethasone  (DECADRON) injection 10 mg (10 mg Intravenous Given 07/13/20 1825)  sodium chloride 0.9 % bolus 1,000 mL (0 mLs Intravenous Stopped 07/13/20 2024)  iohexol (OMNIPAQUE) 350 MG/ML injection 75 mL (75 mLs Intravenous Contrast Given 07/13/20 1920)  ketorolac (TORADOL) 30 MG/ML injection 30 mg (30 mg Intravenous Given 07/13/20 2025)  amoxicillin (AMOXIL) capsule 500 mg (500 mg Oral Given 07/13/20 2025)     ____________________________________________   INITIAL IMPRESSION / ASSESSMENT AND PLAN / ED COURSE  Pertinent labs & imaging results that were available during my care of the patient were reviewed by me and considered in my medical decision making (see chart for details).      Assessment and Plan: Headache:  54 year old male presents to the emergency department with left-sided temporal headache that started acutely today.  Vital signs were reassuring at triage.  On physical exam, patient was alert and active and nontoxic-appearing.  He had no palpable cord along the left temple.  On physical exam, left tympanic membrane was erythematous and effused with evidence of purulent exudate behind left TM.  Patient had no trismus and was able to speak in complete sentences.  His neuro exam was without deficits.  Differential diagnosis included intracranial bleed, aneurysm, temporal arteritis, migraine, otitis media...  ESR and CRP were within reference range, decreasing suspicion for temporal arteritis.  CT head revealed no evidence of intracranial bleed.  No signs of aneurysm on CT angio.  Patient reported that he did have some improvement in headache with Reglan, Decadron, Benadryl and Toradol administered in the emergency department.  Toradol was administered after CTs returned.  I discharge patient home with amoxicillin to be taken twice daily for the next 10 days.  Patient was given return precautions to return if his headache does not improve.  He has easy access to the emergency department should  his symptoms change or worsen.    ____________________________________________  FINAL CLINICAL IMPRESSION(S) / ED DIAGNOSES  Final diagnoses:  Acute nonintractable headache, unspecified headache type      NEW MEDICATIONS STARTED DURING THIS VISIT:  ED Discharge Orders         Ordered    amoxicillin (AMOXIL) 875 MG tablet  2 times daily        07/13/20 2052              This chart was dictated using voice recognition software/Dragon. Despite best efforts to proofread, errors can occur which can change the meaning. Any change was purely unintentional.     Karren Cobble 07/13/20 2107    Vladimir Crofts, MD 07/13/20 2113

## 2020-07-13 NOTE — ED Triage Notes (Signed)
Pt referred here by primary for severe migraine. Pt holding left side of head in triage c/o of pain between temple and eye socket. Pt NAD in triage.

## 2020-07-13 NOTE — ED Notes (Signed)
See triage note  Presents with headache  States pain is mainly to left temporal area  Denies any hx of h/a's  Was sent here by [facility]

## 2020-07-13 NOTE — ED Notes (Signed)
Pt sent over by [facility] for possible left temporal arteritis.

## 2020-07-13 NOTE — Discharge Instructions (Signed)
Take Amoxicillin twice daily for the next ten days.  

## 2020-07-14 LAB — C-REACTIVE PROTEIN: CRP: 0.5 mg/dL (ref <1.0)

## 2021-09-24 IMAGING — CT CT ANGIO HEAD
3 of 7 series · 17 of 47 positions shown · IV contrast (omnipaque)
Comparison: None.

CLINICAL DATA: Dizziness.  Migraine.

EXAM:
CT ANGIOGRAPHY HEAD
TECHNIQUE: Multidetector CT imaging of the head was performed using the
standard protocol during bolus administration of intravenous
contrast. Multiplanar CT image reconstructions and MIPs were
obtained to evaluate the vascular anatomy.
CONTRAST:  75mL OMNIPAQUE IOHEXOL 350 MG/ML SOLN

[Series 8: ax thin · axial · 0.38mm/px · z∈[-86,+43]mm · 11 of 162 slices shown]
[im 10/162  brain]
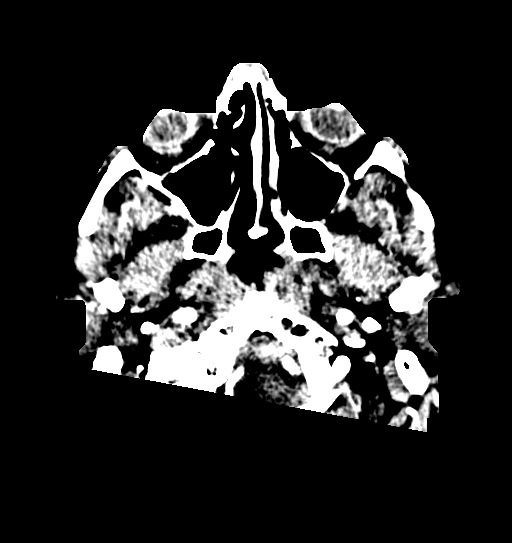
[im 29/162  bone]
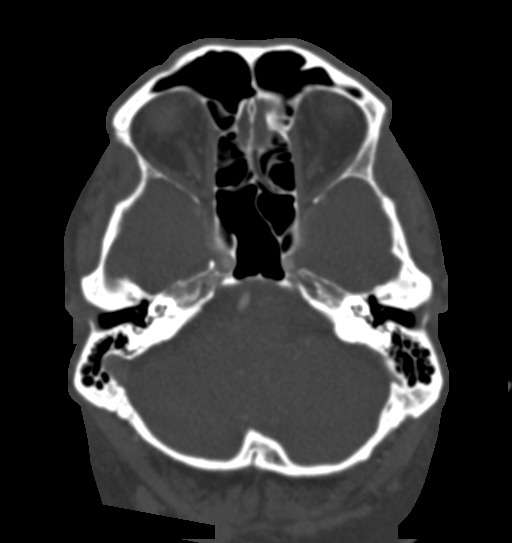
[im 38/162  brain]
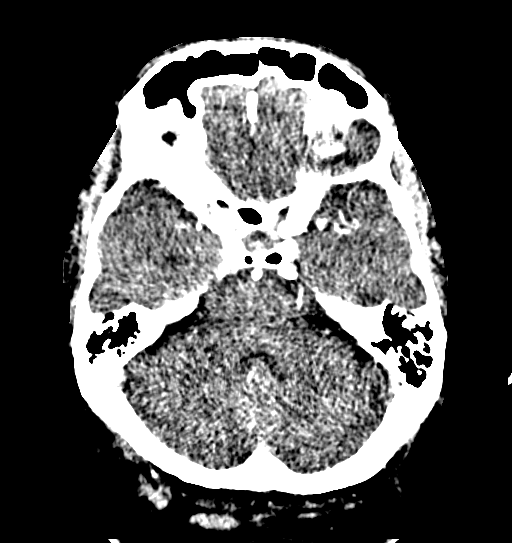
[im 57/162  bone]
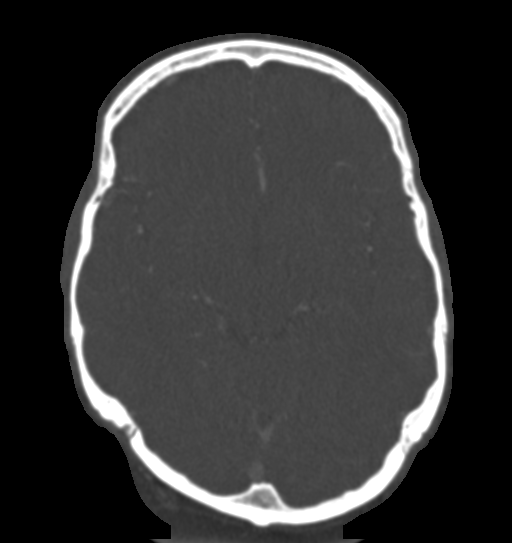
[im 67/162  brain]
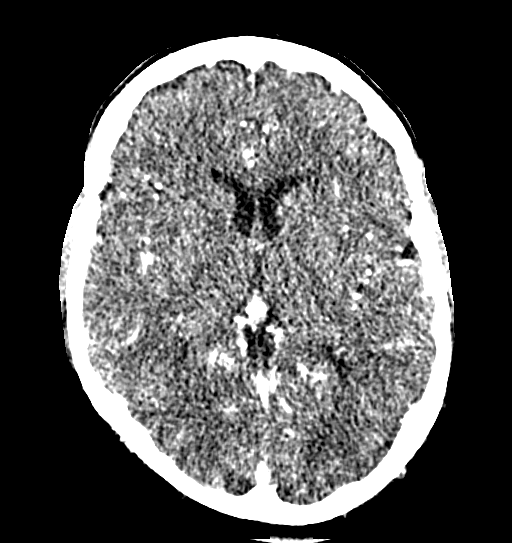
[im 86/162  bone]
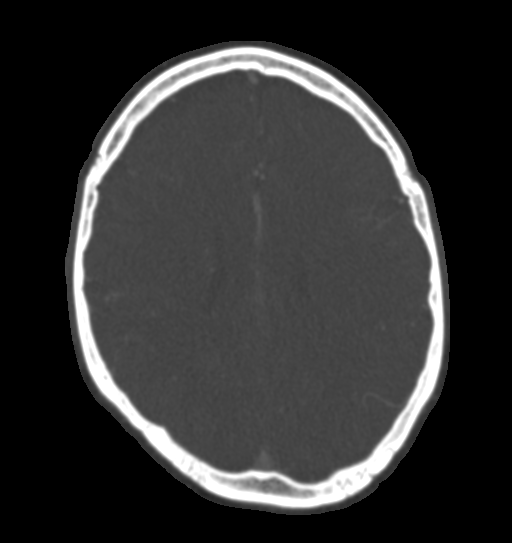
[im 95/162  brain]
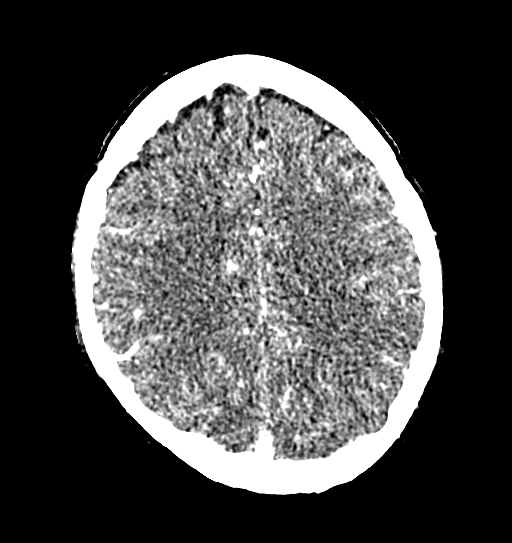
[im 105/162  bone]
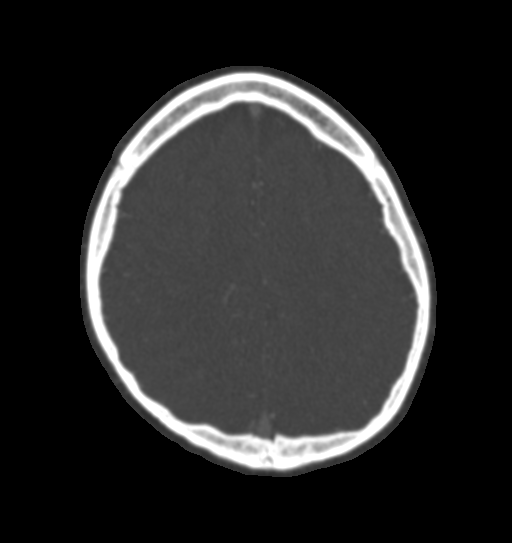
[im 124/162  brain]
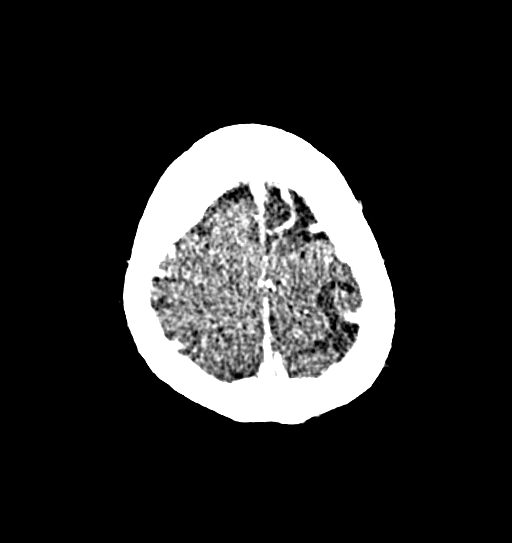
[im 133/162  bone]
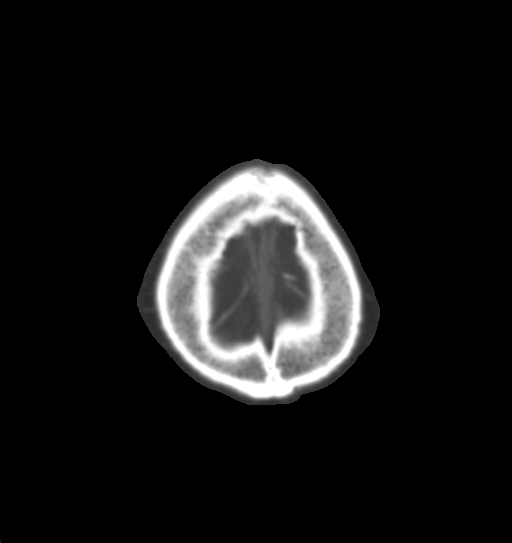
[im 152/162  brain]
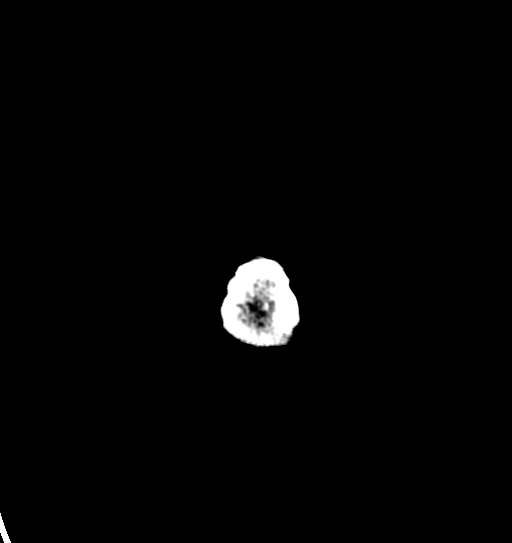

[Series 10: cor thin · coronal · 0.33mm/px · 3 of 209 slices shown]
[im 80/209  brain]
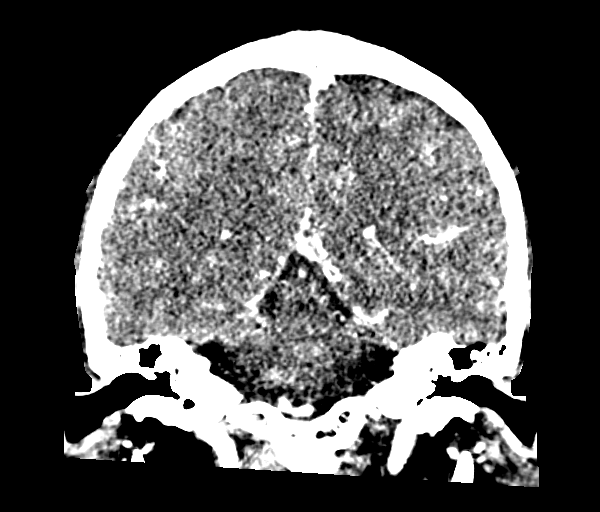
[im 102/209  brain]
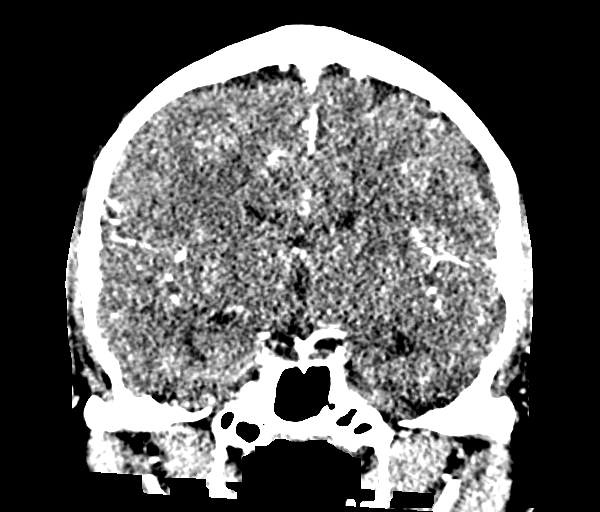
[im 123/209  brain]
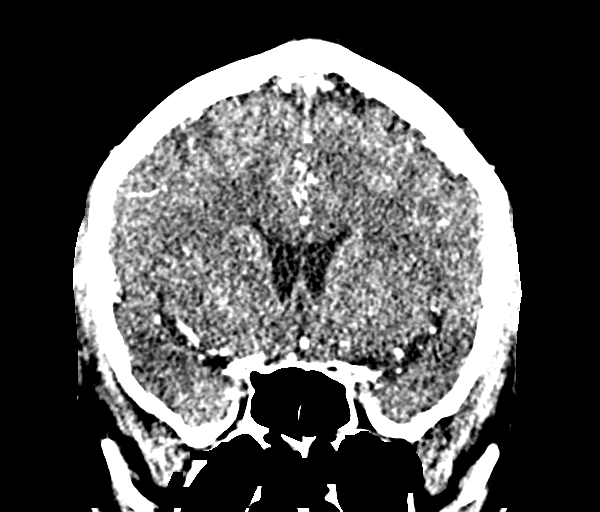

[Series 12: sag thin · sagittal · 0.35mm/px · 3 of 196 slices shown]
[im 76/196  brain]
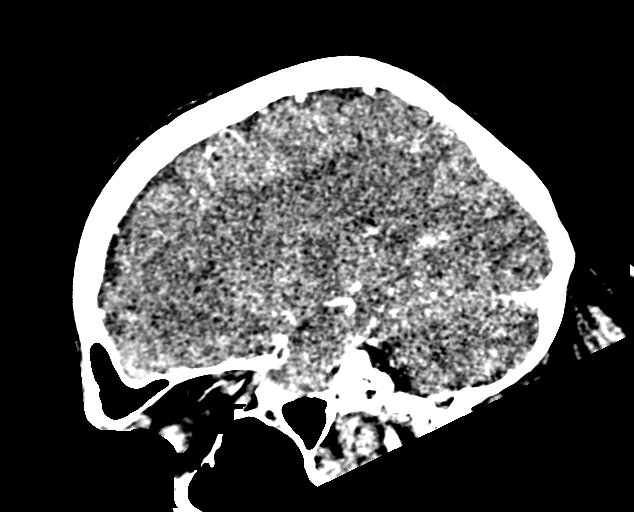
[im 113/196  brain]
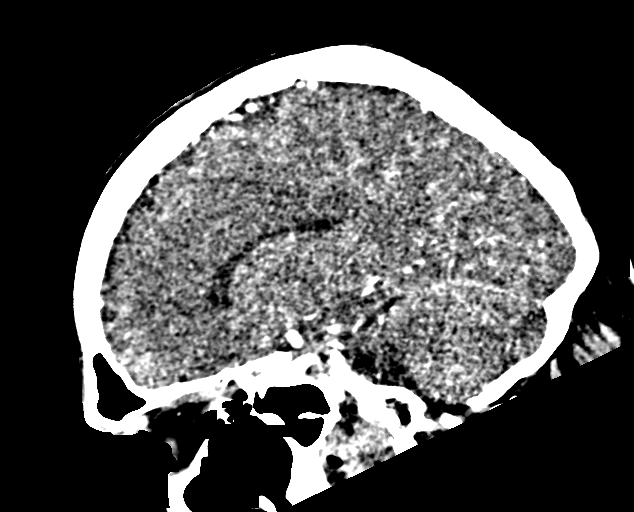
[im 151/196  brain]
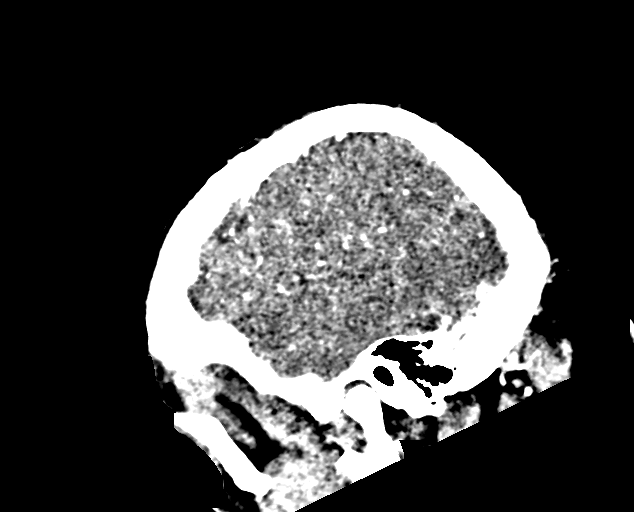

[17 of 47 positions shown; findings below may reference images not displayed]

FINDINGS: Anterior circulation: The internal carotid arteries are widely
patent from skull base to carotid termini. ACAs and MCAs are patent
without evidence of a proximal branch occlusion or significant
proximal stenosis. Mild diffuse irregular appearance of the
predominantly distal branch vessels is considered artifactual. No
aneurysm is identified.

Posterior circulation: The visualized distal vertebral arteries are
patent to the basilar with the left being strongly dominant. Patent
right PICA, left AICA, and bilateral SCA origins are identified. The
basilar artery is widely patent. Posterior communicating arteries
are not identified and may be diminutive or absent. PCAs are patent
without evidence of a significant proximal stenosis. No aneurysm is
identified.

Venous sinuses: Patent.

Anatomic variants: None.
IMPRESSION: Negative head CTA.

## 2021-11-29 DIAGNOSIS — M545 Low back pain, unspecified: Secondary | ICD-10-CM | POA: Diagnosis not present

## 2021-11-29 DIAGNOSIS — M47896 Other spondylosis, lumbar region: Secondary | ICD-10-CM | POA: Diagnosis not present

## 2021-12-10 DIAGNOSIS — R635 Abnormal weight gain: Secondary | ICD-10-CM | POA: Diagnosis not present

## 2021-12-10 DIAGNOSIS — M545 Low back pain, unspecified: Secondary | ICD-10-CM | POA: Diagnosis not present

## 2021-12-10 DIAGNOSIS — K219 Gastro-esophageal reflux disease without esophagitis: Secondary | ICD-10-CM | POA: Diagnosis not present

## 2021-12-10 DIAGNOSIS — Z0000 Encounter for general adult medical examination without abnormal findings: Secondary | ICD-10-CM | POA: Diagnosis not present

## 2021-12-10 DIAGNOSIS — R739 Hyperglycemia, unspecified: Secondary | ICD-10-CM | POA: Diagnosis not present

## 2022-01-23 DIAGNOSIS — D2272 Melanocytic nevi of left lower limb, including hip: Secondary | ICD-10-CM | POA: Diagnosis not present

## 2022-01-23 DIAGNOSIS — L821 Other seborrheic keratosis: Secondary | ICD-10-CM | POA: Diagnosis not present

## 2022-01-23 DIAGNOSIS — L300 Nummular dermatitis: Secondary | ICD-10-CM | POA: Diagnosis not present

## 2022-01-23 DIAGNOSIS — D2271 Melanocytic nevi of right lower limb, including hip: Secondary | ICD-10-CM | POA: Diagnosis not present

## 2022-01-23 DIAGNOSIS — D2262 Melanocytic nevi of left upper limb, including shoulder: Secondary | ICD-10-CM | POA: Diagnosis not present

## 2022-01-23 DIAGNOSIS — D225 Melanocytic nevi of trunk: Secondary | ICD-10-CM | POA: Diagnosis not present

## 2022-01-23 DIAGNOSIS — D2261 Melanocytic nevi of right upper limb, including shoulder: Secondary | ICD-10-CM | POA: Diagnosis not present

## 2022-06-16 DIAGNOSIS — R040 Epistaxis: Secondary | ICD-10-CM | POA: Diagnosis not present

## 2022-06-16 DIAGNOSIS — S01511A Laceration without foreign body of lip, initial encounter: Secondary | ICD-10-CM | POA: Diagnosis not present

## 2022-06-16 DIAGNOSIS — M79642 Pain in left hand: Secondary | ICD-10-CM | POA: Diagnosis not present

## 2022-06-16 DIAGNOSIS — S6392XA Sprain of unspecified part of left wrist and hand, initial encounter: Secondary | ICD-10-CM | POA: Diagnosis not present

## 2022-06-16 DIAGNOSIS — G44319 Acute post-traumatic headache, not intractable: Secondary | ICD-10-CM | POA: Diagnosis not present

## 2022-06-16 DIAGNOSIS — Y9289 Other specified places as the place of occurrence of the external cause: Secondary | ICD-10-CM | POA: Diagnosis not present

## 2022-06-16 DIAGNOSIS — W010XXA Fall on same level from slipping, tripping and stumbling without subsequent striking against object, initial encounter: Secondary | ICD-10-CM | POA: Diagnosis not present

## 2022-06-16 DIAGNOSIS — S0033XA Contusion of nose, initial encounter: Secondary | ICD-10-CM | POA: Diagnosis not present

## 2022-06-16 DIAGNOSIS — R519 Headache, unspecified: Secondary | ICD-10-CM | POA: Diagnosis not present

## 2022-06-16 DIAGNOSIS — S0121XA Laceration without foreign body of nose, initial encounter: Secondary | ICD-10-CM | POA: Diagnosis not present

## 2022-06-16 DIAGNOSIS — S0181XA Laceration without foreign body of other part of head, initial encounter: Secondary | ICD-10-CM | POA: Diagnosis not present

## 2022-06-16 DIAGNOSIS — M79645 Pain in left finger(s): Secondary | ICD-10-CM | POA: Diagnosis not present

## 2022-06-16 DIAGNOSIS — S00531A Contusion of lip, initial encounter: Secondary | ICD-10-CM | POA: Diagnosis not present

## 2022-06-19 DIAGNOSIS — D485 Neoplasm of uncertain behavior of skin: Secondary | ICD-10-CM | POA: Diagnosis not present

## 2022-06-19 DIAGNOSIS — L821 Other seborrheic keratosis: Secondary | ICD-10-CM | POA: Diagnosis not present

## 2022-06-19 DIAGNOSIS — L409 Psoriasis, unspecified: Secondary | ICD-10-CM | POA: Diagnosis not present

## 2022-06-20 DIAGNOSIS — L7622 Postprocedural hemorrhage and hematoma of skin and subcutaneous tissue following other procedure: Secondary | ICD-10-CM | POA: Diagnosis not present

## 2022-06-20 DIAGNOSIS — R0981 Nasal congestion: Secondary | ICD-10-CM | POA: Diagnosis not present

## 2022-09-27 ENCOUNTER — Ambulatory Visit
Admission: EM | Admit: 2022-09-27 | Discharge: 2022-09-27 | Disposition: A | Discharge status: Home / Self Care | Payer: 59 | Attending: Family Medicine | Admitting: Family Medicine

## 2022-09-27 DIAGNOSIS — Z79899 Other long term (current) drug therapy: Secondary | ICD-10-CM | POA: Insufficient documentation

## 2022-09-27 DIAGNOSIS — J101 Influenza due to other identified influenza virus with other respiratory manifestations: Secondary | ICD-10-CM

## 2022-09-27 DIAGNOSIS — Z1152 Encounter for screening for COVID-19: Secondary | ICD-10-CM | POA: Diagnosis not present

## 2022-09-27 DIAGNOSIS — R6889 Other general symptoms and signs: Secondary | ICD-10-CM | POA: Diagnosis not present

## 2022-09-27 LAB — RESP PANEL BY RT-PCR (RSV, FLU A&B, COVID)  RVPGX2
Influenza A by PCR: POSITIVE — AB
Influenza B by PCR: NEGATIVE
Resp Syncytial Virus by PCR: NEGATIVE
SARS Coronavirus 2 by RT PCR: NEGATIVE

## 2022-09-27 MED ORDER — BENZONATATE 100 MG PO CAPS: 200.0000 mg | ORAL_CAPSULE | Freq: Three times a day (TID) | ORAL | 0 refills | Status: DC | PRN

## 2022-09-27 MED ORDER — ALBUTEROL SULFATE HFA 108 (90 BASE) MCG/ACT IN AERS: 2.0000 | INHALATION_SPRAY | RESPIRATORY_TRACT | 0 refills | Status: AC | PRN

## 2022-09-27 MED ORDER — IBUPROFEN 600 MG PO TABS
600.0000 mg | ORAL_TABLET | Freq: Once | ORAL | Status: AC
  Administered 2022-09-27: 600 mg via ORAL

## 2022-09-27 MED ORDER — OSELTAMIVIR PHOSPHATE 75 MG PO CAPS: 75.0000 mg | ORAL_CAPSULE | Freq: Two times a day (BID) | ORAL | 0 refills | Status: AC

## 2022-09-27 MED ORDER — PROMETHAZINE-DM 6.25-15 MG/5ML PO SYRP: 5.0000 mL | ORAL_SOLUTION | Freq: Four times a day (QID) | ORAL | 0 refills | Status: DC | PRN

## 2022-09-27 MED ORDER — IBUPROFEN 800 MG PO TABS: 800.0000 mg | ORAL_TABLET | Freq: Once | ORAL | Status: DC

## 2022-09-27 NOTE — ED Provider Notes (Signed)
[facility]    CSN: [id number] Arrival date & time: 09/27/22  1029      History   Chief Complaint Chief Complaint  Patient presents with   Fever   Generalized Body Aches   Cough    HPI Eric Joseph is a 56 y.o. male.   HPI   Eric Joseph presents for fever and body aches. Has cough for the past 1-2 days.   This morning, he had a low-grade fever. Has chest and nsal  congestion. He has throbbing headache and joint aches. His wife's mother and his sister in law have the flu. Had elevated temperature of 99.6 F.  He hasn't taken anything for his symptoms. He works with dust. No history of smoking or asthma.  Has nausea but no vomiting. No diarrhea or shortness of breath.       History reviewed. No pertinent past medical history.  There are no problems to display for this patient.   History reviewed. No pertinent surgical history.     Home Medications    Prior to Admission medications   Medication Sig Start Date End Date Taking? Authorizing Provider  albuterol (VENTOLIN HFA) 108 (90 Base) MCG/ACT inhaler Inhale 2 puffs into the lungs every 4 (four) hours as needed. 09/27/22  Yes Liane Tribbey, DO  benzonatate (TESSALON) 100 MG capsule Take 2 capsules (200 mg total) by mouth 3 (three) times daily as needed for cough. 09/27/22  Yes Amiaya Mcneeley, Seward Meth, DO  oseltamivir (TAMIFLU) 75 MG capsule Take 1 capsule (75 mg total) by mouth 2 (two) times daily for 5 days. 09/27/22 10/02/22 Yes Gazella Anglin, DO  promethazine-dextromethorphan (PROMETHAZINE-DM) 6.25-15 MG/5ML syrup Take 5 mLs by mouth 4 (four) times daily as needed. 09/27/22  Yes Damany Eastman, DO  DULoxetine (CYMBALTA) 30 MG capsule Take 30 mg by mouth at bedtime. 02/02/15   [provider]  ibuprofen (ADVIL,MOTRIN) 600 MG tablet Take 1 tablet (600 mg total) by mouth every 6 (six) hours as needed for moderate pain. 03/18/15   Gayla Doss, MD  meloxicam (MOBIC) 15 MG tablet Take 1 tablet (15 mg  total) by mouth daily. 08/04/18   Cuthriell, Delorise Royals, PA-C  methylphenidate 10 MG ER tablet Take 10 mg by mouth daily.    [provider]    Family History History reviewed. No pertinent family history.  Social History Social History   Tobacco Use   Smoking status: Never   Smokeless tobacco: Never  Vaping Use   Vaping Use: Never used  Substance Use Topics   Alcohol use: Not Currently   Drug use: Never     Allergies   Patient has no known allergies.   Review of Systems Review of Systems: negative unless otherwise stated in HPI.      Physical Exam Triage Vital Signs ED Triage Vitals  Enc Vitals Group     BP 09/27/22 1248 136/86     Pulse Rate 09/27/22 1247 82     Resp 09/27/22 1248 18     Temp 09/27/22 1247 98.8 F (37.1 C)     Temp Source 09/27/22 1247 Oral     SpO2 09/27/22 1248 100 %     Weight --      Height --      Head Circumference --      Peak Flow --      Pain Score 09/27/22 1250 4     Pain Loc --      Pain Edu? --  Excl. in [place]? --    No data found.  Updated Vital Signs BP 136/86 (BP Location: Left Arm)   Pulse 82   Temp 98.8 F (37.1 C) (Oral)   Resp 18   SpO2 100%   Visual Acuity Right Eye Distance:   Left Eye Distance:   Bilateral Distance:    Right Eye Near:   Left Eye Near:    Bilateral Near:     Physical Exam GEN:     alert, non-toxic appearing male in no distress    HENT:  mucus membranes moist, oropharyngeal without lesions or exudate, no tonsillar hypertrophy, mild oropharyngeal erythema, moderate erythematous edematous turbinates, clear nasal discharge, bilateral TM normal EYES:   pupils equal and reactive, no scleral injection or discharge NECK:  normal ROM, no meningismus   RESP:  no increased work of breathing, clear to auscultation bilaterally CVS:   regular rate and rhythm Skin:   warm and dry, no rash on visible skin    UC Treatments / Results  Labs (all labs ordered are listed, but only abnormal  results are displayed) Labs Reviewed  RESP PANEL BY RT-PCR (RSV, FLU A&B, COVID)  [id number] - Abnormal; Notable for the following components:      Result Value   Influenza A by PCR POSITIVE (*)    All other components within normal limits    EKG   Radiology No results found.  Procedures Procedures (including critical care time)  Medications Ordered in UC Medications  ibuprofen (ADVIL) tablet 600 mg (600 mg Oral Given 09/27/22 1517)    Initial Impression / Assessment and Plan / UC Course  I have reviewed the triage vital signs and the nursing notes.  Pertinent labs & imaging results that were available during my care of the patient were reviewed by me and considered in my medical decision making (see chart for details).       Pt is a 56 y.o. male who presents for 1-2 days of respiratory symptoms. Eric Joseph is afebrile here without recent antipyretics. Satting well on room air. Overall pt is ill but non-toxic appearing, well hydrated, without respiratory distress. Pulmonary exam is unremarkable.  COVID and influenza testing obtained. History consistent with viral respiratory illness. Pt's influenza A test is positive. Discussed symptomatic treatment. Will treat with Tamiflu, promethazine DM, Tessalon perles, and albuterol.  Typical duration of symptoms discussed.   Return and ED precautions given and voiced understanding. Discussed MDM, treatment plan and plan for follow-up with patient who agrees with plan.     Final Clinical Impressions(s) / UC Diagnoses   Final diagnoses:  Influenza A     Discharge Instructions      Your influenza A (flu) test is positive.  If your were prescribed medication, stop by the pharmacy to pick them up.   You can take Tylenol and/or Ibuprofen as needed for fever reduction and pain relief.    For cough: honey 1/2 to 1 teaspoon (you can dilute the honey in water or another fluid).  You can also use guaifenesin and dextromethorphan for cough. You  can use a humidifier for chest congestion and cough.  If you don't have a humidifier, you can sit in the bathroom with the hot shower running.      For sore throat: try warm salt water gargles, Mucinex sore throat cough drops or cepacol lozenges, throat spray, warm tea or water with lemon/honey, popsicles or ice, or OTC cold relief medicine for throat discomfort. You can also purchase chloraseptic  spray at the pharmacy or dollar store.   For congestion: take a daily anti-histamine like Zyrtec, Claritin, and a oral decongestant, such as pseudoephedrine.  You can also use Flonase 1-2 sprays in each nostril daily. Afrin is also a good option, if you do not have high blood pressure.    It is important to stay hydrated: drink plenty of fluids (water, gatorade/powerade/pedialyte, juices, or teas) to keep your throat moisturized and help further relieve irritation/discomfort.    Return or go to the Emergency Department if symptoms worsen or do not improve in the next few days      ED Prescriptions     Medication Sig Dispense Auth. Provider   promethazine-dextromethorphan (PROMETHAZINE-DM) 6.25-15 MG/5ML syrup Take 5 mLs by mouth 4 (four) times daily as needed. 118 mL Anoushka Divito, DO   albuterol (VENTOLIN HFA) 108 (90 Base) MCG/ACT inhaler Inhale 2 puffs into the lungs every 4 (four) hours as needed. 6.7 g Senai Ramnath, DO   benzonatate (TESSALON) 100 MG capsule Take 2 capsules (200 mg total) by mouth 3 (three) times daily as needed for cough. 21 capsule Mikailah Morel, DO   oseltamivir (TAMIFLU) 75 MG capsule Take 1 capsule (75 mg total) by mouth 2 (two) times daily for 5 days. 10 capsule Katha Cabal, DO      PDMP not reviewed this encounter.   Katha Cabal, DO 09/27/22 1525

## 2022-09-27 NOTE — Discharge Instructions (Signed)
Your influenza A (flu) test is positive.  If your were prescribed medication, stop by the pharmacy to pick them up.   You can take Tylenol and/or Ibuprofen as needed for fever reduction and pain relief.    For cough: honey 1/2 to 1 teaspoon (you can dilute the honey in water or another fluid).  You can also use guaifenesin and dextromethorphan for cough. You can use a humidifier for chest congestion and cough.  If you don't have a humidifier, you can sit in the bathroom with the hot shower running.      For sore throat: try warm salt water gargles, Mucinex sore throat cough drops or cepacol lozenges, throat spray, warm tea or water with lemon/honey, popsicles or ice, or OTC cold relief medicine for throat discomfort. You can also purchase chloraseptic spray at the pharmacy or dollar store.   For congestion: take a daily anti-histamine like Zyrtec, Claritin, and a oral decongestant, such as pseudoephedrine.  You can also use Flonase 1-2 sprays in each nostril daily. Afrin is also a good option, if you do not have high blood pressure.    It is important to stay hydrated: drink plenty of fluids (water, gatorade/powerade/pedialyte, juices, or teas) to keep your throat moisturized and help further relieve irritation/discomfort.    Return or go to the Emergency Department if symptoms worsen or do not improve in the next few days

## 2022-09-27 NOTE — ED Triage Notes (Signed)
Onset 2-3 days of body-aches,coughing and low grade temp. Pt has been expose to the flu.

## 2022-10-10 DIAGNOSIS — R0602 Shortness of breath: Secondary | ICD-10-CM | POA: Diagnosis not present

## 2022-10-21 DIAGNOSIS — R0602 Shortness of breath: Secondary | ICD-10-CM | POA: Diagnosis not present

## 2022-11-06 DIAGNOSIS — R0602 Shortness of breath: Secondary | ICD-10-CM | POA: Diagnosis not present

## 2023-01-27 DIAGNOSIS — X32XXXA Exposure to sunlight, initial encounter: Secondary | ICD-10-CM | POA: Diagnosis not present

## 2023-01-27 DIAGNOSIS — D2272 Melanocytic nevi of left lower limb, including hip: Secondary | ICD-10-CM | POA: Diagnosis not present

## 2023-01-27 DIAGNOSIS — L400 Psoriasis vulgaris: Secondary | ICD-10-CM | POA: Diagnosis not present

## 2023-01-27 DIAGNOSIS — L570 Actinic keratosis: Secondary | ICD-10-CM | POA: Diagnosis not present

## 2023-01-27 DIAGNOSIS — D2262 Melanocytic nevi of left upper limb, including shoulder: Secondary | ICD-10-CM | POA: Diagnosis not present

## 2023-01-27 DIAGNOSIS — D2261 Melanocytic nevi of right upper limb, including shoulder: Secondary | ICD-10-CM | POA: Diagnosis not present

## 2023-02-04 DIAGNOSIS — Z1211 Encounter for screening for malignant neoplasm of colon: Secondary | ICD-10-CM | POA: Diagnosis not present

## 2023-02-04 DIAGNOSIS — R7301 Impaired fasting glucose: Secondary | ICD-10-CM | POA: Diagnosis not present

## 2023-02-04 DIAGNOSIS — K219 Gastro-esophageal reflux disease without esophagitis: Secondary | ICD-10-CM | POA: Diagnosis not present

## 2023-02-04 DIAGNOSIS — Z049 Encounter for examination and observation for unspecified reason: Secondary | ICD-10-CM | POA: Diagnosis not present

## 2023-02-04 DIAGNOSIS — Z7185 Encounter for immunization safety counseling: Secondary | ICD-10-CM | POA: Diagnosis not present

## 2023-02-04 DIAGNOSIS — Z0000 Encounter for general adult medical examination without abnormal findings: Secondary | ICD-10-CM | POA: Diagnosis not present

## 2023-02-04 DIAGNOSIS — Z13220 Encounter for screening for lipoid disorders: Secondary | ICD-10-CM | POA: Diagnosis not present

## 2023-02-04 DIAGNOSIS — E559 Vitamin D deficiency, unspecified: Secondary | ICD-10-CM | POA: Diagnosis not present

## 2023-02-04 DIAGNOSIS — Z125 Encounter for screening for malignant neoplasm of prostate: Secondary | ICD-10-CM | POA: Diagnosis not present

## 2023-02-04 DIAGNOSIS — Z1331 Encounter for screening for depression: Secondary | ICD-10-CM | POA: Diagnosis not present

## 2023-05-28 DIAGNOSIS — L728 Other follicular cysts of the skin and subcutaneous tissue: Secondary | ICD-10-CM | POA: Diagnosis not present

## 2023-08-19 DIAGNOSIS — L538 Other specified erythematous conditions: Secondary | ICD-10-CM | POA: Diagnosis not present

## 2023-08-19 DIAGNOSIS — L400 Psoriasis vulgaris: Secondary | ICD-10-CM | POA: Diagnosis not present

## 2023-08-19 DIAGNOSIS — B078 Other viral warts: Secondary | ICD-10-CM | POA: Diagnosis not present

## 2023-09-12 ENCOUNTER — Ambulatory Visit: Payer: 59

## 2023-09-12 ENCOUNTER — Ambulatory Visit
Admission: EM | Admit: 2023-09-12 | Discharge: 2023-09-12 | Disposition: A | Discharge status: Home / Self Care | Payer: 59 | Attending: Emergency Medicine | Admitting: Emergency Medicine

## 2023-09-12 ENCOUNTER — Encounter: Payer: Self-pay | Admitting: Emergency Medicine

## 2023-09-12 DIAGNOSIS — S20211A Contusion of right front wall of thorax, initial encounter: Secondary | ICD-10-CM | POA: Diagnosis not present

## 2023-09-12 DIAGNOSIS — S2241XA Multiple fractures of ribs, right side, initial encounter for closed fracture: Secondary | ICD-10-CM | POA: Diagnosis not present

## 2023-09-12 MED ORDER — OXYCODONE-ACETAMINOPHEN 5-325 MG PO TABS: 1.0000 | ORAL_TABLET | Freq: Four times a day (QID) | ORAL | 0 refills | Status: DC | PRN

## 2023-09-12 NOTE — ED Provider Notes (Addendum)
[facility]    CSN: [id number] Arrival date & time: 09/12/23  2725      History   Chief Complaint Chief Complaint  Patient presents with   rib cage pain     HPI Eric Joseph is a 57 y.o. male.   HPI  57 year old male without any significant past medical history presents for evaluation of right-sided rib pain after suffering a ground-level fall 7 days ago.  He reports that he landed on his right shoulder after tripping over a cord and he felt a crunching in his ribs.  He has pain in the front and back of his right chest wall that increases with movement, coughing, sneezing, or hiccups.  He states it does not hurt to take a deep breath.  He has not noticed any bruising to his chest wall and he denies any shortness of breath.  Also no hemoptysis.  History reviewed. No pertinent past medical history.  There are no problems to display for this patient.   History reviewed. No pertinent surgical history.     Home Medications    Prior to Admission medications   Medication Sig Start Date End Date Taking? Authorizing Provider  oxyCODONE-acetaminophen (PERCOCET/ROXICET) 5-325 MG tablet Take 1-2 tablets by mouth every 6 (six) hours as needed for severe pain (pain score 7-10). No more than 6 tablets in total per 24-hour period. 09/12/23  Yes Becky Augusta, NP  albuterol (VENTOLIN HFA) 108 (90 Base) MCG/ACT inhaler Inhale 2 puffs into the lungs every 4 (four) hours as needed. 09/27/22   Brimage, Seward Meth, DO  benzonatate (TESSALON) 100 MG capsule Take 2 capsules (200 mg total) by mouth 3 (three) times daily as needed for cough. 09/27/22   Katha Cabal, DO  DULoxetine (CYMBALTA) 30 MG capsule Take 30 mg by mouth at bedtime. 02/02/15   [provider]  ibuprofen (ADVIL,MOTRIN) 600 MG tablet Take 1 tablet (600 mg total) by mouth every 6 (six) hours as needed for moderate pain. 03/18/15   Gayla Doss, MD  meloxicam (MOBIC) 15 MG tablet Take 1 tablet (15 mg total) by  mouth daily. 08/04/18   Cuthriell, Delorise Royals, PA-C  methylphenidate 10 MG ER tablet Take 10 mg by mouth daily.    [provider]  promethazine-dextromethorphan (PROMETHAZINE-DM) 6.25-15 MG/5ML syrup Take 5 mLs by mouth 4 (four) times daily as needed. 09/27/22   Katha Cabal, DO    Family History History reviewed. No pertinent family history.  Social History Social History   Tobacco Use   Smoking status: Never   Smokeless tobacco: Never  Vaping Use   Vaping status: Never Used  Substance Use Topics   Alcohol use: Not Currently   Drug use: Never     Allergies   Morphine and No known allergies   Review of Systems Review of Systems  Respiratory:  Negative for cough and shortness of breath.   Cardiovascular:  Positive for chest pain.       Right-sided rib pain.  Skin:  Negative for color change.     Physical Exam Triage Vital Signs ED Triage Vitals  Encounter Vitals Group     BP      Systolic BP Percentile      Diastolic BP Percentile      Pulse      Resp      Temp      Temp src      SpO2      Weight      Height  Head Circumference      Peak Flow      Pain Score      Pain Loc      Pain Education      Exclude from Growth Chart    No data found.  Updated Vital Signs BP 116/74 (BP Location: Left Arm)   Pulse 88   Temp 98.5 F (36.9 C) (Oral)   Resp 18   SpO2 99%   Visual Acuity Right Eye Distance:   Left Eye Distance:   Bilateral Distance:    Right Eye Near:   Left Eye Near:    Bilateral Near:     Physical Exam Vitals and nursing note reviewed.  Constitutional:      Appearance: Normal appearance. He is not ill-appearing.  HENT:     Head: Normocephalic and atraumatic.  Cardiovascular:     Rate and Rhythm: Normal rate and regular rhythm.     Pulses: Normal pulses.     Heart sounds: Normal heart sounds. No murmur heard.    No friction rub. No gallop.  Pulmonary:     Effort: Pulmonary effort is normal.     Breath sounds:  Normal breath sounds. No wheezing, rhonchi or rales.     Comments: Patient has point tenderness with palpation of the fifth rib in the midaxillary line.  No appreciable crepitus.  No ecchymosis noted. Chest:     Chest wall: Tenderness present.  Skin:    General: Skin is warm and dry.     Capillary Refill: Capillary refill takes less than 2 seconds.  Neurological:     General: No focal deficit present.     Mental Status: He is alert and oriented to person, place, and time.      UC Treatments / Results  Labs (all labs ordered are listed, but only abnormal results are displayed) Labs Reviewed - No data to display  EKG   Radiology No results found.  Procedures Procedures (including critical care time)  Medications Ordered in UC Medications - No data to display  Initial Impression / Assessment and Plan / UC Course  I have reviewed the triage vital signs and the nursing notes.  Pertinent labs & imaging results that were available during my care of the patient were reviewed by me and considered in my medical decision making (see chart for details).   Patient is a pleasant 57 year old male who appears to be in a moderate degree of discomfort presenting for evaluation of right-sided chest wall pain after suffering a ground-level fall 7 days ago.  He is able to speak in full sentence without dyspnea or tachypnea and his room air oxygen saturation is 99% with a respiratory rate of 18.  Lung sounds are clear to auscultation all fields.  Patient inspection of the chest wall does not reveal any edema, ecchymosis, or erythema.  He does have tenderness to palpation of the fifth rib in the midaxillary line but no appreciable crepitus.  I will obtain right rib series to evaluate for the presence of any bony abnormality.  Right rib films independently reviewed and evaluated by me.  Impression: No evidence of pneumo or hemothorax present on the chest x-ray.  No appreciable rib fractures noted.   Radiology overread is pending. Radiology impression states nondisplaced fractures of the distal/anterior right sixth and seventh ribs.  I will discharge patient home with a diagnosis of right chest wall contusion and have him use topical lidocaine patches as well as over-the-counter NSAIDs to help with  pain and inflammation.  He can also apply ice or moist heat to his chest wall to help with pain and inflammation.  PDMP does not show any open prescription so I will give him a short prescription of Percocet that he can use for more severe pain and at bedtime.  Patient has a listed allergy to morphine which causes itching, but review of medical records indicates that he was prescribed oxycodone for hernia repair in 2022 and tolerated it well.  I called and spoke with the patient after verifying his identity through name and date of birth and updated him as to the radiologist findings.  There is no change in medical management.   Final Clinical Impressions(s) / UC Diagnoses   Final diagnoses:  Contusion of ribs, right, initial encounter     Discharge Instructions      Your chest x-ray did not demonstrate any evidence of broken ribs.  I do believe that you have bruised your chest wall, which can be just as painful.  Make sure that you are taking at least 10 deep breaths every hour to help keep your lungs inflated and prevent the formation of pneumonia.  You may use over-the-counter Tylenol and/or ibuprofen according to package instructions as needed for mild to moderate pain.  You may also apply topical lidocaine patches to your chest wall at the site of pain.  These patches are good for 8 hours apiece.  You may also apply ice or moist heat to your chest wall, which ever you prefer, top with pain and inflammation.  I have sent over a prescription for Percocet that she can use as needed for severe pain.  Be mindful this medication will make you drowsy so do not drink alcohol or drive if you  take it.  If you are taking other Tylenol containing products make sure that you are not taking more than 4000 mg of Tylenol a day.  If your symptoms are not improving, or new symptoms develop, either return for reevaluation or see your primary care provider.     ED Prescriptions     Medication Sig Dispense Auth. Provider   oxyCODONE-acetaminophen (PERCOCET/ROXICET) 5-325 MG tablet Take 1-2 tablets by mouth every 6 (six) hours as needed for severe pain (pain score 7-10). No more than 6 tablets in total per 24-hour period. 15 tablet Becky Augusta, NP      I have reviewed the PDMP during this encounter.   Becky Augusta, NP 09/12/23 1035    Becky Augusta, NP 09/12/23 1036    Becky Augusta, NP 09/12/23 1053

## 2023-09-12 NOTE — ED Triage Notes (Addendum)
Pt fell on 11/22 and landed on his right side and continues to have rib cage pain. He tripped over a cord outside and fell on grass.

## 2023-09-12 NOTE — Discharge Instructions (Addendum)
Your chest x-ray did not demonstrate any evidence of broken ribs.  I do believe that you have bruised your chest wall, which can be just as painful.  Make sure that you are taking at least 10 deep breaths every hour to help keep your lungs inflated and prevent the formation of pneumonia.  You may use over-the-counter Tylenol and/or ibuprofen according to package instructions as needed for mild to moderate pain.  You may also apply topical lidocaine patches to your chest wall at the site of pain.  These patches are good for 8 hours apiece.  You may also apply ice or moist heat to your chest wall, which ever you prefer, top with pain and inflammation.  I have sent over a prescription for Percocet that she can use as needed for severe pain.  Be mindful this medication will make you drowsy so do not drink alcohol or drive if you take it.  If you are taking other Tylenol containing products make sure that you are not taking more than 4000 mg of Tylenol a day.  If your symptoms are not improving, or new symptoms develop, either return for reevaluation or see your primary care provider.

## 2023-10-23 DIAGNOSIS — L400 Psoriasis vulgaris: Secondary | ICD-10-CM | POA: Diagnosis not present

## 2023-11-18 DIAGNOSIS — M1991 Primary osteoarthritis, unspecified site: Secondary | ICD-10-CM | POA: Diagnosis not present

## 2023-11-18 DIAGNOSIS — L409 Psoriasis, unspecified: Secondary | ICD-10-CM | POA: Diagnosis not present

## 2023-11-18 DIAGNOSIS — M79671 Pain in right foot: Secondary | ICD-10-CM | POA: Diagnosis not present

## 2023-11-18 DIAGNOSIS — M79672 Pain in left foot: Secondary | ICD-10-CM | POA: Diagnosis not present

## 2023-11-18 DIAGNOSIS — E669 Obesity, unspecified: Secondary | ICD-10-CM | POA: Diagnosis not present

## 2023-11-18 DIAGNOSIS — M79641 Pain in right hand: Secondary | ICD-10-CM | POA: Diagnosis not present

## 2023-11-18 DIAGNOSIS — Z6830 Body mass index (BMI) 30.0-30.9, adult: Secondary | ICD-10-CM | POA: Diagnosis not present

## 2023-11-18 DIAGNOSIS — M79642 Pain in left hand: Secondary | ICD-10-CM | POA: Diagnosis not present

## 2024-01-20 DIAGNOSIS — Z79899 Other long term (current) drug therapy: Secondary | ICD-10-CM | POA: Diagnosis not present

## 2024-04-13 DIAGNOSIS — K5730 Diverticulosis of large intestine without perforation or abscess without bleeding: Secondary | ICD-10-CM | POA: Diagnosis not present

## 2024-04-13 DIAGNOSIS — D122 Benign neoplasm of ascending colon: Secondary | ICD-10-CM | POA: Diagnosis not present

## 2024-04-13 DIAGNOSIS — Z1211 Encounter for screening for malignant neoplasm of colon: Secondary | ICD-10-CM | POA: Diagnosis not present

## 2024-04-13 DIAGNOSIS — Z860101 Personal history of adenomatous and serrated colon polyps: Secondary | ICD-10-CM | POA: Diagnosis not present

## 2024-04-13 DIAGNOSIS — K640 First degree hemorrhoids: Secondary | ICD-10-CM | POA: Diagnosis not present

## 2024-09-23 ENCOUNTER — Ambulatory Visit
Admission: EM | Admit: 2024-09-23 | Discharge: 2024-09-23 | Disposition: A | Discharge status: Home / Self Care | Attending: Physician Assistant | Admitting: Physician Assistant

## 2024-09-23 DIAGNOSIS — R519 Headache, unspecified: Secondary | ICD-10-CM | POA: Diagnosis not present

## 2024-09-23 DIAGNOSIS — R0981 Nasal congestion: Secondary | ICD-10-CM | POA: Diagnosis not present

## 2024-09-23 DIAGNOSIS — J069 Acute upper respiratory infection, unspecified: Secondary | ICD-10-CM | POA: Diagnosis not present

## 2024-09-23 DIAGNOSIS — R051 Acute cough: Secondary | ICD-10-CM | POA: Diagnosis not present

## 2024-09-23 LAB — POCT INFLUENZA A/B
Influenza A, POC: NEGATIVE
Influenza B, POC: NEGATIVE

## 2024-09-23 LAB — POC SOFIA SARS ANTIGEN FIA: SARS Coronavirus 2 Ag: NEGATIVE

## 2024-09-23 MED ORDER — PSEUDOEPH-BROMPHEN-DM 30-2-10 MG/5ML PO SYRP: 10.0000 mL | ORAL_SOLUTION | Freq: Four times a day (QID) | ORAL | 0 refills | Status: AC | PRN

## 2024-09-23 MED ORDER — IPRATROPIUM BROMIDE 0.06 % NA SOLN: 2.0000 | Freq: Four times a day (QID) | NASAL | 0 refills | Status: AC

## 2024-09-23 NOTE — Discharge Instructions (Signed)
URI/COLD SYMPTOMS: Your exam today is consistent with a viral illness. Antibiotics are not indicated at this time. Use medications as directed, including cough syrup, nasal saline, and decongestants. Your symptoms should improve over the next few days and resolve within a couple weeks. Increase rest and fluids. F/u if symptoms worsen or predominate such as sore throat, ear pain, productive cough, shortness of breath, or if you develop high fevers or worsening fatigue over the next several days.

## 2024-09-23 NOTE — ED Provider Notes (Signed)
 [facility]    CSN: [id number] Arrival date & time: 09/23/24  1045      History   Chief Complaint Chief Complaint  Patient presents with   Headache    HPI Eric Joseph is a 58 y.o. male presenting for fatigue, cough, congestion, headaches, mild sore throat and body aches x 2 days.  Denies fever, ear pain, sinus pain, chest pain, wheezing, shortness of breath, abdominal pain, vomiting or diarrhea.  Reports having yellowish-green nasal drainage and sputum.  Patient has been taking over-the-counter meds. No other complaints.   HPI  History reviewed. No pertinent past medical history.  There are no active problems to display for this patient.   History reviewed. No pertinent surgical history.     Home Medications    Prior to Admission medications  Medication Sig Start Date End Date Taking? Authorizing Provider  brompheniramine-pseudoephedrine-DM 30-2-10 MG/5ML syrup Take 10 mLs by mouth 4 (four) times daily as needed for up to 7 days. 09/23/24 09/30/24 Yes Eric Jolan NOVAK, PA-C  folic acid (FOLVITE) 1 MG tablet Take 1 mg by mouth daily. 06/14/24  Yes [provider]  ipratropium (ATROVENT) 0.06 % nasal spray Place 2 sprays into both nostrils 4 (four) times daily. 09/23/24  Yes Eric Jolan NOVAK, PA-C  methotrexate (RHEUMATREX) 2.5 MG tablet SMARTSIG:7 Tablet(s) By Mouth Once a Week 09/23/24  Yes [provider]  albuterol  (VENTOLIN  HFA) 108 (90 Base) MCG/ACT inhaler Inhale 2 puffs into the lungs every 4 (four) hours as needed. 09/27/22   Brimage, Vondra, DO  DULoxetine (CYMBALTA) 30 MG capsule Take 30 mg by mouth at bedtime. 02/02/15   [provider]  ibuprofen  (ADVIL ,MOTRIN ) 600 MG tablet Take 1 tablet (600 mg total) by mouth every 6 (six) hours as needed for moderate pain. 03/18/15   Sharman Sena LABOR, MD  methylphenidate 10 MG ER tablet Take 10 mg by mouth daily.    [provider]    Family History History reviewed. No  pertinent family history.  Social History Social History[1]   Allergies   Morphine and No known allergies   Review of Systems Review of Systems  Constitutional:  Positive for fatigue. Negative for fever.  HENT:  Positive for congestion, postnasal drip, rhinorrhea, sinus pressure and sore throat.   Respiratory:  Positive for cough. Negative for shortness of breath.   Cardiovascular:  Negative for chest pain.  Gastrointestinal:  Negative for abdominal pain, diarrhea, nausea and vomiting.  Musculoskeletal:  Negative for myalgias.  Neurological:  Positive for headaches. Negative for weakness and light-headedness.  Hematological:  Negative for adenopathy.     Physical Exam Triage Vital Signs ED Triage Vitals  Encounter Vitals Group     BP      Girls Systolic BP Percentile      Girls Diastolic BP Percentile      Boys Systolic BP Percentile      Boys Diastolic BP Percentile      Pulse      Resp      Temp      Temp src      SpO2      Weight      Height      Head Circumference      Peak Flow      Pain Score      Pain Loc      Pain Education      Exclude from Growth Chart    No data found.  Updated Vital Signs  BP 114/86 (BP Location: Left Arm)   Pulse 75   Temp 98.1 F (36.7 C) (Oral)   Resp 16   SpO2 97%      Physical Exam Vitals and nursing note reviewed.  Constitutional:      General: He is not in acute distress.    Appearance: Normal appearance. He is well-developed. He is not ill-appearing.  HENT:     Head: Normocephalic and atraumatic.     Nose: Congestion present.     Mouth/Throat:     Mouth: Mucous membranes are moist.     Pharynx: Oropharynx is clear. Posterior oropharyngeal erythema present.  Eyes:     General: No scleral icterus.    Conjunctiva/sclera: Conjunctivae normal.  Cardiovascular:     Rate and Rhythm: Normal rate and regular rhythm.  Pulmonary:     Effort: Pulmonary effort is normal. No respiratory distress.     Breath sounds:  Normal breath sounds.  Musculoskeletal:     Cervical back: Neck supple.  Skin:    General: Skin is warm and dry.     Capillary Refill: Capillary refill takes less than 2 seconds.  Neurological:     General: No focal deficit present.     Mental Status: He is alert. Mental status is at baseline.     Motor: No weakness.     Gait: Gait normal.  Psychiatric:        Mood and Affect: Mood normal.        Behavior: Behavior normal.      UC Treatments / Results  Labs (all labs ordered are listed, but only abnormal results are displayed) Labs Reviewed  POC SOFIA SARS ANTIGEN FIA - Normal  POCT INFLUENZA A/B - Normal    EKG   Radiology No results found.  Procedures Procedures (including critical care time)  Medications Ordered in UC Medications - No data to display  Initial Impression / Assessment and Plan / UC Course  I have reviewed the triage vital signs and the nursing notes.  Pertinent labs & imaging results that were available during my care of the patient were reviewed by me and considered in my medical decision making (see chart for details).   58 y/o male presents for 2 day history of congestion, cough, headaches and sinus pressure. No fever, chest pain or SOB.  Negative COVID strep testing.  Viral URI.  Supportive care encouraged increased rest and fluids.  Sent Bromfed-DM Atrovent nasal.  Pharmacy.  Tylenol  and Motrin  suggested as needed for headaches.  Advised patient antibiotics not indicated at this time.  Advised if he develops fever, worsening symptoms or is not feeling better after 10 days he should return for reevaluation.   Final Clinical Impressions(s) / UC Diagnoses   Final diagnoses:  Acute cough  Viral upper respiratory tract infection  Nasal congestion  Acute nonintractable headache, unspecified headache type     Discharge Instructions      URI/COLD SYMPTOMS: Your exam today is consistent with a viral illness. Antibiotics are not indicated at  this time. Use medications as directed, including cough syrup, nasal saline, and decongestants. Your symptoms should improve over the next few days and resolve within a couple weeks. Increase rest and fluids. F/u if symptoms worsen or predominate such as sore throat, ear pain, productive cough, shortness of breath, or if you develop high fevers or worsening fatigue over the next several days.       ED Prescriptions     Medication Sig Dispense Auth.  Provider   ipratropium (ATROVENT) 0.06 % nasal spray Place 2 sprays into both nostrils 4 (four) times daily. 15 mL Eric Huxley B, PA-C   brompheniramine-pseudoephedrine-DM 30-2-10 MG/5ML syrup Take 10 mLs by mouth 4 (four) times daily as needed for up to 7 days. 150 mL Eric Huxley NOVAK, PA-C      PDMP not reviewed this encounter.     [1]  Social History Tobacco Use   Smoking status: Never   Smokeless tobacco: Never  Vaping Use   Vaping status: Never Used  Substance Use Topics   Alcohol use: Not Currently   Drug use: Never     Eric Joseph Eric Joseph 09/23/24 1252

## 2024-09-23 NOTE — ED Triage Notes (Signed)
 Patient presents to UC for productive cough, HA x 2 days. States mucous is green. Concerned with sinus infection. Treating with dayquil and nyquil.   Denies fever or SOB.
